# Patient Record
Sex: Male | Born: 1985
Health system: Southern US, Community
[De-identification: ages and names within clinical notes are randomized; demographics above are authoritative.]

## PROBLEM LIST (undated history)

## (undated) DIAGNOSIS — S129XXA Fracture of neck, unspecified, initial encounter: Secondary | ICD-10-CM

## (undated) DIAGNOSIS — G5600 Carpal tunnel syndrome, unspecified upper limb: Secondary | ICD-10-CM

## (undated) HISTORY — PX: SHOULDER SURGERY: SHX246

---
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
## 2005-11-28 ENCOUNTER — Emergency Department (HOSPITAL_COMMUNITY): Admission: EM | Admit: 2005-11-28 | Discharge: 2005-11-28 | Payer: Self-pay | Admitting: Emergency Medicine

## 2005-12-04 ENCOUNTER — Emergency Department (HOSPITAL_COMMUNITY): Admission: EM | Admit: 2005-12-04 | Discharge: 2005-12-04 | Payer: Self-pay | Admitting: Family Medicine

## 2009-08-14 ENCOUNTER — Emergency Department (HOSPITAL_COMMUNITY): Admission: EM | Admit: 2009-08-14 | Discharge: 2009-08-14 | Payer: Self-pay | Admitting: Emergency Medicine

## 2014-03-12 ENCOUNTER — Encounter (HOSPITAL_BASED_OUTPATIENT_CLINIC_OR_DEPARTMENT_OTHER): Payer: Self-pay | Admitting: *Deleted

## 2014-03-12 ENCOUNTER — Emergency Department (HOSPITAL_BASED_OUTPATIENT_CLINIC_OR_DEPARTMENT_OTHER)
Admission: EM | Admit: 2014-03-12 | Discharge: 2014-03-12 | Disposition: A | Discharge status: Home / Self Care | Payer: BC Managed Care – PPO | Attending: Emergency Medicine | Admitting: Emergency Medicine

## 2014-03-12 DIAGNOSIS — Y9389 Activity, other specified: Secondary | ICD-10-CM | POA: Insufficient documentation

## 2014-03-12 DIAGNOSIS — Z720 Tobacco use: Secondary | ICD-10-CM | POA: Insufficient documentation

## 2014-03-12 DIAGNOSIS — Y998 Other external cause status: Secondary | ICD-10-CM | POA: Insufficient documentation

## 2014-03-12 DIAGNOSIS — Y9289 Other specified places as the place of occurrence of the external cause: Secondary | ICD-10-CM | POA: Diagnosis not present

## 2014-03-12 DIAGNOSIS — Z23 Encounter for immunization: Secondary | ICD-10-CM | POA: Diagnosis not present

## 2014-03-12 DIAGNOSIS — X58XXXA Exposure to other specified factors, initial encounter: Secondary | ICD-10-CM | POA: Insufficient documentation

## 2014-03-12 DIAGNOSIS — S60454A Superficial foreign body of right ring finger, initial encounter: Secondary | ICD-10-CM | POA: Diagnosis not present

## 2014-03-12 DIAGNOSIS — S6991XA Unspecified injury of right wrist, hand and finger(s), initial encounter: Secondary | ICD-10-CM | POA: Diagnosis present

## 2014-03-12 DIAGNOSIS — S60459A Superficial foreign body of unspecified finger, initial encounter: Secondary | ICD-10-CM

## 2014-03-12 MED ORDER — TETANUS-DIPHTH-ACELL PERTUSSIS 5-2.5-18.5 LF-MCG/0.5 IM SUSP
0.5000 mL | Freq: Once | INTRAMUSCULAR | Status: AC
  Administered 2014-03-12: 0.5 mL via INTRAMUSCULAR
  Filled 2014-03-12: qty 0.5

## 2014-03-12 MED ORDER — LIDOCAINE HCL (PF) 1 % IJ SOLN
INTRAMUSCULAR | Status: AC
  Filled 2014-03-12: qty 5

## 2014-03-12 MED ORDER — LIDOCAINE HCL (PF) 1 % IJ SOLN
5.0000 mL | Freq: Once | INTRAMUSCULAR | Status: AC
  Administered 2014-03-12: 5 mL via INTRADERMAL
  Filled 2014-03-12: qty 5

## 2014-03-12 NOTE — ED Notes (Signed)
Pt c/o splinter under the nail to right ring finger x 1 hr

## 2014-03-12 NOTE — Discharge Instructions (Signed)
Take Tylenol and Motrin for pain. Keep clean and apply topical antibiotics for 2-3 days. If you were given medicines take as directed.  If you are on coumadin or contraceptives realize their levels and effectiveness is altered by many different medicines.  If you have any reaction (rash, tongues swelling, other) to the medicines stop taking and see a physician.   Please follow up as directed and return to the ER or see a physician for new or worsening symptoms.  Thank you. Filed Vitals:   03/12/14 2058  BP: 91/61  Pulse: 60  Temp: 97.7 F (36.5 C)  TempSrc: Oral  Resp: 18  Height: 5\' 8"  (1.727 m)  Weight: 124 lb (56.246 kg)  SpO2: 100%

## 2014-03-12 NOTE — ED Provider Notes (Signed)
CSN: [id number]     Arrival date & time 03/12/14  2051 History  This chart was scribed for Erik Reyes M Shariyah Eland, MD by Chandni Bhalodia, ED Scribe. This patient was seen in room MH08/MH08 and the patient's care was started at 9:33 PM.   Chief Complaint  Patient presents with  . Finger Injury   Patient is a 28 y.o. male presenting with hand pain. The history is provided by the patient. No language interpreter was used.  Hand Pain This is a new problem. The current episode started less than 1 hour ago. The problem occurs constantly. The problem has not changed since onset.Pertinent negatives include no chest pain, no abdominal pain, no headaches and no shortness of breath. Nothing aggravates the symptoms. Nothing relieves the symptoms. He has tried nothing for the symptoms.   HPI Comments: Erik Reyes is a 28 y.o. male who presents to the Emergency Department complaining of right ring finger injury that occurred 1 hour ago. He states he was installing hard wood floors and had a splinter get lodged into his right ring finger. He denies any other associated pain. He is not actively bleeding upon examination.  History reviewed. No pertinent past medical history. History reviewed. No pertinent past surgical history. History reviewed. No pertinent family history. History  Substance Use Topics  . Smoking status: Current Every Day Smoker -- 0.50 packs/day    Types: Cigarettes  . Smokeless tobacco: Not on file  . Alcohol Use: No   Review of Systems  Respiratory: Negative for shortness of breath.   Cardiovascular: Negative for chest pain.  Gastrointestinal: Negative for abdominal pain.  Skin: Positive for wound.  Neurological: Negative for headaches.  All other systems reviewed and are negative.  Allergies  Review of patient's allergies indicates no known allergies.  Home Medications   Prior to Admission medications   Not on File   Triage Vitals: BP 91/61 mmHg  Pulse 60  Temp(Src)  97.7 F (36.5 C) (Oral)  Resp 18  Ht 5\' 8"  (1.727 m)  Wt 124 lb (56.246 kg)  BMI 18.86 kg/m2  SpO2 100%  Physical Exam  Constitutional: He is oriented to person, place, and time. He appears well-developed and well-nourished. No distress.  HENT:  Head: Normocephalic and atraumatic.  Eyes: Conjunctivae and EOM are normal.  Neck: Neck supple. No tracheal deviation present.  Cardiovascular: Normal rate.   Pulmonary/Chest: Effort normal. No respiratory distress.  Musculoskeletal: Normal range of motion.  Neurological: He is alert and oriented to person, place, and time.  Skin: Skin is warm and dry.  Large splinter extending entire nail bed of right ring finger. There is no bleeding. There is no pus draining. Patient can move and extend finger.  Psychiatric: He has a normal mood and affect. His behavior is normal.  Nursing note and vitals reviewed.  ED Course  Procedures (including critical care time)  DIAGNOSTIC STUDIES: Oxygen Saturation is 100% on RA, normal by my interpretation.    COORDINATION OF CARE: 9:50 PM- Discussed plans to give patient 2Marland Kitchen0mKentuck821yLBarbara Co68Marland Kitchenm3688WH34Marland KitchenmiKentuck316ygBarbara C41Marland KitchenmoKentuck711yBBarbara C76Marland KitchenmoKentuck424yPBarbara CoLond63Marland KitchenmoKentuck261ynBarbara CoParagon45Marland Kitchenm [place]3636yEBarbara CoHunt8Marland KitchenmeKentuck6557yrBarbara CoA7Marland KitchenmtKentuck4532ylBarbara CoS28Marland KitchenmtKentuck664y.Barbara C50Marland KitchenmoKentuck7603yBBarbara CoLincolndocaine 1% injection. Pt advised of plan for treatment and pt agrees.  Labs Review Labs Reviewed - No data to display  Imaging Review No results found.   EKG Interpretation None     MDM  Finger Block 5 mL of 1% lidocaine used proximal ring finger on the right. Finger cleaned with Betadine prior. Patient had minimal sensation distal to finger block afterwards.  Foreign body removal right nailbed Finger block performed. Scissors  and forceps utilized to pull large 1.5 cm splinter Patient tolerated well   Final diagnoses:  Acute foreign body of fingernail, initial encounter   Finger block performed. Wound care performed. Splinter removed from nail bed.   I personally performed the services described in this documentation, which was scribed in my presence. The recorded information has been reviewed and is  accurate.  Enid SkeensJoshua M Ellice Boultinghouse, MD 03/12/14 2207

## 2015-03-04 ENCOUNTER — Encounter (HOSPITAL_BASED_OUTPATIENT_CLINIC_OR_DEPARTMENT_OTHER): Payer: Self-pay | Admitting: *Deleted

## 2015-03-04 ENCOUNTER — Emergency Department (HOSPITAL_BASED_OUTPATIENT_CLINIC_OR_DEPARTMENT_OTHER)
Admission: EM | Admit: 2015-03-04 | Discharge: 2015-03-05 | Disposition: A | Discharge status: Home / Self Care | Payer: BLUE CROSS/BLUE SHIELD | Source: Home / Self Care | Attending: Emergency Medicine | Admitting: Emergency Medicine

## 2015-03-04 DIAGNOSIS — L03113 Cellulitis of right upper limb: Secondary | ICD-10-CM | POA: Diagnosis present

## 2015-03-04 DIAGNOSIS — F17210 Nicotine dependence, cigarettes, uncomplicated: Secondary | ICD-10-CM | POA: Insufficient documentation

## 2015-03-04 DIAGNOSIS — L02511 Cutaneous abscess of right hand: Secondary | ICD-10-CM

## 2015-03-04 DIAGNOSIS — L0390 Cellulitis, unspecified: Principal | ICD-10-CM

## 2015-03-04 DIAGNOSIS — L0291 Cutaneous abscess, unspecified: Secondary | ICD-10-CM

## 2015-03-04 DIAGNOSIS — L02413 Cutaneous abscess of right upper limb: Secondary | ICD-10-CM | POA: Diagnosis not present

## 2015-03-04 MED ORDER — DOXYCYCLINE HYCLATE 100 MG PO TABS
100.0000 mg | ORAL_TABLET | Freq: Once | ORAL | Status: AC
Start: 2015-03-04 — End: 2015-03-04
  Administered 2015-03-04: 100 mg via ORAL
  Filled 2015-03-04: qty 1

## 2015-03-04 MED ORDER — VANCOMYCIN HCL IN DEXTROSE 1-5 GM/200ML-% IV SOLN
1000.0000 mg | Freq: Once | INTRAVENOUS | Status: AC
Start: 2015-03-04 — End: 2015-03-05
  Administered 2015-03-04: 1000 mg via INTRAVENOUS
  Filled 2015-03-04: qty 200

## 2015-03-04 MED ORDER — LIDOCAINE-EPINEPHRINE (PF) 2 %-1:200000 IJ SOLN
INTRAMUSCULAR | Status: AC
  Administered 2015-03-04: 10 mL
  Filled 2015-03-04: qty 10

## 2015-03-04 NOTE — ED Provider Notes (Signed)
CSN: [id number]   Arrival date & time 03/04/15 2210  History  By signing my name below, I, Erik Reyes, attest that this documentation has been prepared under the direction and in the presence of Paula LibraJohn Zita Ozimek, MD. Electronically Signed: Bethel BornBritney Reyes, ED Scribe. 03/04/2015. 11:23 PM.  Chief Complaint  Patient presents with  . Insect Bite    HPI The history is provided by the patient and a significant other. No language interpreter was used.   Erik PriestWilliam C Reyes is a 29 y.o. male who presents to the Emergency Department complaining of an area of redness, pain and swelling at the right forearm with onset 4 days ago. There is minimal pain at rest, moderate pain with palpation or movement. Pt associates the lesion with an insect bite but did not see the insect that bit him. His girlfriend made an incision at the area today and states that " a lot of pus came out". Pt denies fever, chills, nausea, vomiting and diarrhea.   History reviewed. No pertinent past medical history.  History reviewed. No pertinent past surgical history.  No family history on file.  Social History  Substance Use Topics  . Smoking status: Current Every Day Smoker -- 0.50 packs/day    Types: Cigarettes  . Smokeless tobacco: None  . Alcohol Use: No     Review of Systems 10 Systems reviewed and all are negative for acute change except as noted in the HPI. Home Medications   Prior to Admission medications   Not on File    Allergies  Review of patient's allergies indicates no known allergies.  Triage Vitals: BP 120/81 mmHg  Pulse 76  Temp(Src) 97.6 F (36.4 C) (Oral)  Resp 18  Ht 5\' 8"  (1.727 m)  Wt 124 lb (56.246 kg)  BMI 18.86 kg/m2  SpO2 98%  Physical Exam General: Well-developed, well-nourished male in no acute distress; appearance consistent with age of record HENT: normocephalic; atraumatic Eyes: pupils equal, round and reactive to light; extraocular muscles intact Neck: supple Heart:  regular rate and rhythm Lungs: clear to auscultation bilaterally Abdomen: soft; nondistended; nontender; bowel sounds present Extremities: No deformity; full range of motion; pulses normal Neurologic: Awake, alert and oriented; motor function intact in all extremities and symmetric; no facial droop Skin: Warm and dry; tender raised lesion of the right forearm with surrounding erythema:   Psychiatric: Normal mood and affect  ED Course  Procedures  INCISION AND DRAINAGE PROCEDURE NOTE: Patient identification was confirmed and verbal consent was obtained. This procedure was performed by Paula LibraJohn Rilei Kravitz, MD at 11:18 PM. Site: right forearm Sterile procedures observed Needle size: 25 guage Anesthetic used (type and amt): 1.5 cc of lidocaine with epinephrine  Blade size: 11  Drainage: a small amount of purulent drainage  Complexity: Simple  Site anesthetized, incision made over site, wound drained and explored loculations, covered with dry, sterile dressing. Pt tolerated procedure well without complications. Instructions for care discussed verbally and pt provided with additional written instructions for homecare and f/u.   DIAGNOSTIC STUDIES: Oxygen Saturation is 98% on RA, normal by my interpretation.    COORDINATION OF CARE: 11:16 PM Discussed treatment plan which includes abx and I&D with pt at bedside and pt agreed to plan.   MDM  Extent of cellulitis was marked with a skin marking pen and he was given 1 gram of vancomycin in the ED. We will discharge him home on doxycycline. He was advised to return if symptoms worsen.  Final diagnoses:  Cellulitis and  abscess   I personally performed the services described in this documentation, which was scribed in my presence. The recorded information has been reviewed and is accurate.    Paula Libra, MD 03/05/15 [id number]

## 2015-03-04 NOTE — ED Notes (Signed)
Possible insect bite to his right forearm x 3 days. Red, swollen, painful and hot to touch. He has been squeezing it and his girlfriend tried to cut into it. Redness extends to include most of his forearm.

## 2015-03-05 ENCOUNTER — Encounter (HOSPITAL_BASED_OUTPATIENT_CLINIC_OR_DEPARTMENT_OTHER): Payer: Self-pay

## 2015-03-05 ENCOUNTER — Emergency Department (HOSPITAL_BASED_OUTPATIENT_CLINIC_OR_DEPARTMENT_OTHER)
Admission: EM | Admit: 2015-03-05 | Discharge: 2015-03-05 | Disposition: A | Discharge status: Home / Self Care | Payer: BLUE CROSS/BLUE SHIELD | Attending: Emergency Medicine | Admitting: Emergency Medicine

## 2015-03-05 DIAGNOSIS — L0291 Cutaneous abscess, unspecified: Secondary | ICD-10-CM

## 2015-03-05 DIAGNOSIS — F17210 Nicotine dependence, cigarettes, uncomplicated: Secondary | ICD-10-CM | POA: Insufficient documentation

## 2015-03-05 DIAGNOSIS — L02413 Cutaneous abscess of right upper limb: Secondary | ICD-10-CM | POA: Insufficient documentation

## 2015-03-05 MED ORDER — HYDROCODONE-ACETAMINOPHEN 5-325 MG PO TABS: 1.0000 | ORAL_TABLET | Freq: Once | ORAL | Status: DC

## 2015-03-05 MED ORDER — CEFTRIAXONE SODIUM 1 G IJ SOLR
1.0000 g | Freq: Once | INTRAMUSCULAR | Status: AC
  Administered 2015-03-05: 1 g via INTRAMUSCULAR
  Filled 2015-03-05: qty 10

## 2015-03-05 MED ORDER — DOXYCYCLINE HYCLATE 100 MG PO CAPS: 100.0000 mg | ORAL_CAPSULE | Freq: Two times a day (BID) | ORAL | Status: DC

## 2015-03-05 MED ORDER — HYDROCODONE-ACETAMINOPHEN 5-325 MG PO TABS: 2.0000 | ORAL_TABLET | ORAL | Status: DC | PRN

## 2015-03-05 MED ORDER — LIDOCAINE HCL (PF) 1 % IJ SOLN
INTRAMUSCULAR | Status: AC
  Administered 2015-03-05: 2.1 mL
  Filled 2015-03-05: qty 5

## 2015-03-05 NOTE — ED Provider Notes (Signed)
CSN: [id number]     Arrival date & time 03/05/15  1823 History  By signing my name below, I, Erik Reyes, attest that this documentation has been prepared under the direction and in the presence of Rolland PorterMark Macy Polio, MD. Electronically Signed: Evon Slackerrance Reyes, ED Scribe. 03/05/2015. 8:34 PM.     Chief Complaint  Patient presents with  . Cellulitis   The history is provided by the patient. No language interpreter was used.   HPI Comments: Erik Reyes is a 29 y.o. male who presents to the Emergency Department complaining of worsening abscess to his right forearm. Pt states that he has an abscessed drained yesterday and sent home on antibiotics. Pt states that he has not filled the antibiotic prescription. Pt reports the abscess has been draining. He states that the redness is spreading. Pt doesn't report fever nausea or vomiting.     History reviewed. No pertinent past medical history. History reviewed. No pertinent past surgical history. No family history on file. Social History  Substance Use Topics  . Smoking status: Current Every Day Smoker -- 0.50 packs/day    Types: Cigarettes  . Smokeless tobacco: None  . Alcohol Use: No    Review of Systems  Constitutional: Negative for fever, chills, diaphoresis, appetite change and fatigue.  HENT: Negative for mouth sores, sore throat and trouble swallowing.   Eyes: Negative for visual disturbance.  Respiratory: Negative for cough, chest tightness, shortness of breath and wheezing.   Cardiovascular: Negative for chest pain.  Gastrointestinal: Negative for nausea, vomiting, abdominal pain, diarrhea and abdominal distention.  Endocrine: Negative for polydipsia, polyphagia and polyuria.  Genitourinary: Negative for dysuria, frequency and hematuria.  Musculoskeletal: Negative for gait problem.  Skin: Positive for color change and wound (Abcss). Negative for pallor.  Neurological: Negative for dizziness, syncope, light-headedness and  headaches.  Hematological: Does not bruise/bleed easily.  Psychiatric/Behavioral: Negative for behavioral problems and confusion.      Allergies  Review of patient's allergies indicates no known allergies.  Home Medications   Prior to Admission medications   Medication Sig Start Date End Date Taking? Authorizing Provider  doxycycline (VIBRAMYCIN) 100 MG capsule Take 1 capsule (100 mg total) by mouth 2 (two) times daily. One po bid x 7 days 03/05/15   Paula LibraJohn Molpus, MD  HYDROcodone-acetaminophen (NORCO) 5-325 MG tablet Take 2 tablets by mouth every 4 (four) hours as needed (for pain). 03/05/15   John Molpus, MD   BP 119/77 mmHg  Pulse 86  Temp(Src) 98 F (36.7 C) (Oral)  Resp 16  Ht 5\' 8"  (1.727 m)  Wt 124 lb (56.246 kg)  BMI 18.86 kg/m2  SpO2 100%   Physical Exam  Constitutional: He is oriented to person, place, and time. He appears well-developed and well-nourished. No distress.  HENT:  Head: Normocephalic.  Eyes: Conjunctivae are normal. Pupils are equal, round, and reactive to light. No scleral icterus.  Neck: Normal range of motion. Neck supple. No thyromegaly present.  Cardiovascular: Normal rate and regular rhythm.  Exam reveals no gallop and no friction rub.   No murmur heard. Pulmonary/Chest: Effort normal and breath sounds normal. No respiratory distress. He has no wheezes. He has no rales.  Abdominal: Soft. Bowel sounds are normal. He exhibits no distension. There is no tenderness. There is no rebound.  Musculoskeletal: Normal range of motion.  Neurological: He is alert and oriented to person, place, and time.  Skin: Skin is warm and dry. No rash noted.  Area of erythrema that has extended  proximally. Bed side ultra sound shows continued abscess. .5 dollar size of erythema and fluctuance right volar forearm   Psychiatric: He has a normal mood and affect. His behavior is normal.    ED Course  Procedures (including critical care time) DIAGNOSTIC STUDIES: Oxygen  Saturation is 100% on RA, normal by my interpretation.    COORDINATION OF CARE: 8:07 PM-Discussed treatment plan with pt at bedside and pt agreed to plan.     Labs Review Labs Reviewed - No data to display  Imaging Review No results found.    EKG Interpretation None      MDM   Final diagnoses:  Abscess    Bedside ultrasound shows continued area of fluid collection. It is medial to the previous lateral incision and drainage incision from yesterday. He was reanesthetized locally, and  wound was incised and drained. After incision and drainage he was irrigated and gauze placed. Prior to gauze packing, re-P ultrasound shows resolution of the previously noted subcutaneous fluid collection.  INCISION AND DRAINAGE Performed by: Claudean Kinds Consent: Verbal consent obtained. Risks and benefits: risks, benefits and alternatives were discussed Type: abscess  Body area: Right forearm  Anesthesia: local infiltration  Incision was made with a scalpel.  Local anesthetic: lidocaine 1% c epinephrine  Anesthetic total: 4 ml  Complexity: complex Blunt dissection to break up loculations  Drainage: purulent  Drainage amount: Scant, purulent  Packing material: 1/4 in iodoform gauze  Patient tolerance: Patient tolerated the procedure well with no immediate complications.        Rolland Porter, MD 03/05/15 2035

## 2015-03-05 NOTE — Discharge Instructions (Signed)
Soak, then removed the gauze from the wound tomorrow. Fill your prescription, and actually take your antibiotic. Recheck here in 48 hours if not improving.  Abscess An abscess is an infected area that contains a collection of pus and debris.It can occur in almost any part of the body. An abscess is also known as a furuncle or boil. CAUSES  An abscess occurs when tissue gets infected. This can occur from blockage of oil or sweat glands, infection of hair follicles, or a minor injury to the skin. As the body tries to fight the infection, pus collects in the area and creates pressure under the skin. This pressure causes pain. People with weakened immune systems have difficulty fighting infections and get certain abscesses more often.  SYMPTOMS Usually an abscess develops on the skin and becomes a painful mass that is red, warm, and tender. If the abscess forms under the skin, you may feel a moveable soft area under the skin. Some abscesses break open (rupture) on their own, but most will continue to get worse without care. The infection can spread deeper into the body and eventually into the bloodstream, causing you to feel ill.  DIAGNOSIS  Your caregiver will take your medical history and perform a physical exam. A sample of fluid may also be taken from the abscess to determine what is causing your infection. TREATMENT  Your caregiver may prescribe antibiotic medicines to fight the infection. However, taking antibiotics alone usually does not cure an abscess. Your caregiver may need to make a small cut (incision) in the abscess to drain the pus. In some cases, gauze is packed into the abscess to reduce pain and to continue draining the area. HOME CARE INSTRUCTIONS   Only take over-the-counter or prescription medicines for pain, discomfort, or fever as directed by your caregiver.  If you were prescribed antibiotics, take them as directed. Finish them even if you start to feel better.  If gauze is  used, follow your caregiver's directions for changing the gauze.  To avoid spreading the infection:  Keep your draining abscess covered with a bandage.  Wash your hands well.  Do not share personal care items, towels, or whirlpools with others.  Avoid skin contact with others.  Keep your skin and clothes clean around the abscess.  Keep all follow-up appointments as directed by your caregiver. SEEK MEDICAL CARE IF:   You have increased pain, swelling, redness, fluid drainage, or bleeding.  You have muscle aches, chills, or a general ill feeling.  You have a fever. MAKE SURE YOU:   Understand these instructions.  Will watch your condition.  Will get help right away if you are not doing well or get worse.   This information is not intended to replace advice given to you by your health care provider. Make sure you discuss any questions you have with your health care provider.   Document Released: 12/09/2004 Document Revised: 08/31/2011 Document Reviewed: 05/14/2011 Elsevier Interactive Patient Education 2016 [employer]  Incision and Drainage Incision and drainage is a procedure in which a sac-like structure (cystic structure) is opened and drained. The area to be drained usually contains material such as pus, fluid, or blood.  LET YOUR CAREGIVER KNOW ABOUT:   Allergies to medicine.  Medicines taken, including vitamins, herbs, eyedrops, over-the-counter medicines, and creams.  Use of steroids (by mouth or creams).  Previous problems with anesthetics or numbing medicines.  History of bleeding problems or blood clots.  Previous surgery.  Other health problems, including  diabetes and kidney problems.  Possibility of pregnancy, if this applies. RISKS AND COMPLICATIONS  Pain.  Bleeding.  Scarring.  Infection. BEFORE THE PROCEDURE  You may need to have an ultrasound or other imaging tests to see how large or deep your cystic structure is. Blood tests may also  be used to determine if you have an infection or how severe the infection is. You may need to have a tetanus shot. PROCEDURE  The affected area is cleaned with a cleaning fluid. The cyst area will then be numbed with a medicine (local anesthetic). A small incision will be made in the cystic structure. A syringe or catheter may be used to drain the contents of the cystic structure, or the contents may be squeezed out. The area will then be flushed with a cleansing solution. After cleansing the area, it is often gently packed with a gauze or another wound dressing. Once it is packed, it will be covered with gauze and tape or some other type of wound dressing. AFTER THE PROCEDURE   Often, you will be allowed to go home right after the procedure.  You may be given antibiotic medicine to prevent or heal an infection.  If the area was packed with gauze or some other wound dressing, you will likely need to come back in 1 to 2 days to get it removed.  The area should heal in about 14 days.   This information is not intended to replace advice given to you by your health care provider. Make sure you discuss any questions you have with your health care provider.   Document Released: 08/25/2000 Document Revised: 08/31/2011 Document Reviewed: 04/26/2011 Elsevier Interactive Patient Education [employer]

## 2015-03-05 NOTE — Discharge Instructions (Signed)
Cellulitis Cellulitis is an infection of the skin and the tissue beneath it. The infected area is usually red and tender. Cellulitis occurs most often in the arms and lower legs.  CAUSES  Cellulitis is caused by bacteria that enter the skin through cracks or cuts in the skin. The most common types of bacteria that cause cellulitis are staphylococci and streptococci. SIGNS AND SYMPTOMS   Redness and warmth.  Swelling.  Tenderness or pain.  Fever. DIAGNOSIS  Your health care provider can usually determine what is wrong based on a physical exam. Blood tests may also be done. TREATMENT  Treatment usually involves taking an antibiotic medicine. HOME CARE INSTRUCTIONS   Take your antibiotic medicine as directed by your health care provider. Finish the antibiotic even if you start to feel better.  Keep the infected arm or leg elevated to reduce swelling.  Apply a warm cloth to the affected area up to 4 times per day to relieve pain.  Take medicines only as directed by your health care provider.  Keep all follow-up visits as directed by your health care provider. SEEK MEDICAL CARE IF:   You notice red streaks coming from the infected area.  Your red area gets larger or turns dark in color.  Your bone or joint underneath the infected area becomes painful after the skin has healed.  Your infection returns in the same area or another area.  You develop new symptoms.  You have a fever. SEEK IMMEDIATE MEDICAL CARE IF:   You feel very sleepy.  You develop vomiting or diarrhea.  You have a general ill feeling (malaise) with muscle aches and pains.   This information is not intended to replace advice given to you by your health care provider. Make sure you discuss any questions you have with your health care provider.   Document Released: 12/09/2004 Document Revised: 11/20/2014 Document Reviewed: 05/17/2011 Elsevier Interactive Patient Education [employer]

## 2015-03-05 NOTE — ED Notes (Signed)
Recheck of cellulitis to right forearm

## 2016-01-02 ENCOUNTER — Emergency Department (HOSPITAL_BASED_OUTPATIENT_CLINIC_OR_DEPARTMENT_OTHER)
Admission: EM | Admit: 2016-01-02 | Discharge: 2016-01-02 | Disposition: A | Discharge status: Home / Self Care | Payer: BLUE CROSS/BLUE SHIELD | Attending: Emergency Medicine | Admitting: Emergency Medicine

## 2016-01-02 ENCOUNTER — Emergency Department (HOSPITAL_BASED_OUTPATIENT_CLINIC_OR_DEPARTMENT_OTHER): Payer: BLUE CROSS/BLUE SHIELD

## 2016-01-02 ENCOUNTER — Encounter (HOSPITAL_BASED_OUTPATIENT_CLINIC_OR_DEPARTMENT_OTHER): Payer: Self-pay | Admitting: *Deleted

## 2016-01-02 DIAGNOSIS — M62838 Other muscle spasm: Secondary | ICD-10-CM | POA: Insufficient documentation

## 2016-01-02 DIAGNOSIS — F17210 Nicotine dependence, cigarettes, uncomplicated: Secondary | ICD-10-CM | POA: Insufficient documentation

## 2016-01-02 DIAGNOSIS — R0789 Other chest pain: Secondary | ICD-10-CM | POA: Insufficient documentation

## 2016-01-02 MED ORDER — NAPROXEN 500 MG PO TABS: 500.0000 mg | ORAL_TABLET | Freq: Two times a day (BID) | ORAL | 0 refills | Status: DC

## 2016-01-02 MED ORDER — IBUPROFEN 400 MG PO TABS
400.0000 mg | ORAL_TABLET | Freq: Once | ORAL | Status: AC
Start: 2016-01-02 — End: 2016-01-02
  Administered 2016-01-02: 400 mg via ORAL
  Filled 2016-01-02: qty 1

## 2016-01-02 MED ORDER — CYCLOBENZAPRINE HCL 10 MG PO TABS: 10.0000 mg | ORAL_TABLET | Freq: Two times a day (BID) | ORAL | 0 refills | Status: DC | PRN

## 2016-01-02 MED FILL — NAPROXEN 500 MG TABLET: 500 | 15 days supply | Qty: 30 | Fill #0

## 2016-01-02 MED FILL — CYCLOBENZAPRINE 10 MG TAB: 10 | 10 days supply | Qty: 20 | Fill #0

## 2016-01-02 NOTE — ED Provider Notes (Signed)
[facility] Provider [facility]   CSN: [id number] Arrival date & time: 01/02/16  0745     History   Chief Complaint Chief Complaint  Patient presents with  . Chest Pain    HPI Erik Reyes is a 30 y.o. male.  Patient is a 30 year old male with no significant history except for tobacco abuse presenting today with back and chest pain. Patient states that he works in [employer] and is always doing heavy lifting but remembers lifting something extremely heavy yesterday and feeling some pain in his back. However when he woke up this morning he had pain in his back and his chest. It was on the left side of his chest and seems to be worse with a deep breath. The pain is 4 out of 5 and slightly sharp in nature. It does not radiate anywhere except into his back but not into his arms. He denies any cough, shortness of breath, nausea, abdominal pain or vomiting. The pain is starting to improve since 6 AM when he woke up it is still there. He has not taken anything to help improve the pain. Patient states he does often have back And upper extremity pain from his job. He has seen his doctor for this and was referred to an orthopedist but never saw them. He also states that for the last several weeks he wakes up in the mornings with numbness and tingling in his hands bilaterally which resolved within 15-20 minutes of waking up. They thought he may have carpal tunnel but he has not followed up and does not wear braces at this time. He denies any weakness of the upper extremities or lower extremities. He denies any neck pain at this time.   The history is provided by the patient.  Chest Pain      History reviewed. No pertinent past medical history.  There are no active problems to display for this patient.   History reviewed. No pertinent surgical history.     Home Medications    Prior to Admission medications   Medication Sig Start Date End Date Taking? Authorizing  Provider  cyclobenzaprine (FLEXERIL) 10 MG tablet Take 1 tablet (10 mg total) by mouth 2 (two) times daily as needed for muscle spasms. 01/02/16   Gwyneth Sprout, MD  naproxen (NAPROSYN) 500 MG tablet Take 1 tablet (500 mg total) by mouth 2 (two) times daily. 01/02/16   Gwyneth Sprout, MD    Family History History reviewed. No pertinent family history.  Social History Social History  Substance Use Topics  . Smoking status: Current Every Day Smoker    Packs/day: 0.50    Types: Cigarettes  . Smokeless tobacco: Never Used  . Alcohol use No     Allergies   Review of patient's allergies indicates no known allergies.   Review of Systems Review of Systems  Cardiovascular: Positive for chest pain.  All other systems reviewed and are negative.    Physical Exam Updated Vital Signs BP 113/72 (BP Location: Right Arm)   Pulse (!) 52   Temp 98 F (36.7 C) (Oral)   Resp 18   Ht 5\' 8"  (1.727 m)   Wt 124 lb (56.2 kg)   SpO2 100%   BMI 18.85 kg/m   Physical Exam  Constitutional: He is oriented to person, place, and time. He appears well-developed and well-nourished. No distress.  HENT:  Head: Normocephalic and atraumatic.  Mouth/Throat: Oropharynx is clear and moist.  Eyes: Conjunctivae and EOM are  normal. Pupils are equal, round, and reactive to light.  Neck: Normal range of motion. Neck supple.  Cardiovascular: Normal rate, regular rhythm and intact distal pulses.   No murmur heard. Pulmonary/Chest: Effort normal. No respiratory distress. He has decreased breath sounds. He has no wheezes. He has no rales. He exhibits tenderness. He exhibits no mass, no crepitus and no swelling.    Abdominal: Soft. He exhibits no distension. There is no tenderness. There is no rebound and no guarding.  Musculoskeletal: Normal range of motion. He exhibits no edema.       Cervical back: He exhibits tenderness, pain and spasm. He exhibits no bony tenderness.       Back:  Tenderness and  palpable spasm of the left trapezius and thoracic paraspinal muscles on the left  Neurological: He is alert and oriented to person, place, and time. He has normal strength. No sensory deficit.  5 out of 5 upper extremity strength bilaterally  Skin: Skin is warm and dry. No rash noted. No erythema.  Psychiatric: He has a normal mood and affect. His behavior is normal.  Nursing note and vitals reviewed.    ED Treatments / Results  Labs (all labs ordered are listed, but only abnormal results are displayed) Labs Reviewed - No data to display  EKG  EKG Interpretation  Date/Time:  Friday January 02 2016 07:53:53 EDT Ventricular Rate:  58 PR Interval:    QRS Duration: 90 QT Interval:  392 QTC Calculation: 385 R Axis:   70 Text Interpretation:  Sinus rhythm Probable left atrial enlargement Probable left ventricular hypertrophy ST elev, probable normal early repol pattern No previous tracing Confirmed by Anitra Lauth  MD, Alphonzo Lemmings ([id number]) on 01/02/2016 8:07:24 AM       Radiology Dg Chest 2 View  Result Date: 01/02/2016 CLINICAL DATA:  Chest pain.  Tobacco use. EXAM: CHEST  2 VIEW COMPARISON:  None. FINDINGS: Lungs are clear. Heart size and pulmonary vascularity are normal. No adenopathy. No pneumothorax. No bone lesions. IMPRESSION: No edema or consolidation. Electronically Signed   By: Bretta Bang III M.D.   On: 01/02/2016 08:54    Procedures Procedures (including critical care time)  Medications Ordered in ED Medications  ibuprofen (ADVIL,MOTRIN) tablet 400 mg (400 mg Oral Given 01/02/16 0846)     Initial Impression / Assessment and Plan / ED Course  I have reviewed the triage vital signs and the nursing notes.  Pertinent labs & imaging results that were available during my care of the patient were reviewed by me and considered in my medical decision making (see chart for details).  Clinical Course    Patient is a 30 year old healthy male presenting today with chest  pain. Pain is atypical and seems to be most likely musculoskeletal in nature. It is worse with a deep breath and certain movements. It radiates into his back which is also worse when he moves his left arm. He denies any cough, shortness of breath, infectious symptoms or abdominal pain. Low suspicion at this time for dissection, PE or ACS. Patient is PERC negative.  EKG shows early repolarization but otherwise within normal limits and chest x-ray is within normal limits. Patient has a heart score of 1 because he is a smoker but no family history of heart disease and no other concerning symptoms.  Chest x-ray without signs of pneumothorax and will treat for musculoskeletal pain. Also patient has been having numbness and tingling in bilateral hands for the last 2 weeks only when he wakes  up in the morning. Seems that it may be carpal tunnel in nature. He was supposed to follow-up with specialist does not done so yet. Patient given wrist splints to wear at night as this may help his symptoms. He was given naproxen and Flexeril as well.  Final Clinical Impressions(s) / ED Diagnoses   Final diagnoses:  Chest wall pain  Muscle spasm    New Prescriptions Discharge Medication List as of 01/02/2016  9:09 AM    START taking these medications   Details  cyclobenzaprine (FLEXERIL) 10 MG tablet Take 1 tablet (10 mg total) by mouth 2 (two) times daily as needed for muscle spasms., Starting Fri 01/02/2016, Print    naproxen (NAPROSYN) 500 MG tablet Take 1 tablet (500 mg total) by mouth 2 (two) times daily., Starting Fri 01/02/2016, Print         Gwyneth SproutWhitney Vester Balthazor, MD 01/02/16 1059

## 2016-01-02 NOTE — ED Triage Notes (Addendum)
Pt reports awakening with numbness and tingling in his arms and hands every morning, this morning the numbness was accompanied by left sided chest pressure. Pt states pressure is still present rates at 2/10, states pain increases with deep inspiration. Denies dizzyness, sob, nausea or any other c/o. ekg performed prior to pt triage.

## 2016-10-06 ENCOUNTER — Emergency Department (HOSPITAL_BASED_OUTPATIENT_CLINIC_OR_DEPARTMENT_OTHER)
Admission: EM | Admit: 2016-10-06 | Discharge: 2016-10-06 | Disposition: A | Discharge status: Home / Self Care | Payer: 59 | Attending: Emergency Medicine | Admitting: Emergency Medicine

## 2016-10-06 ENCOUNTER — Encounter (HOSPITAL_BASED_OUTPATIENT_CLINIC_OR_DEPARTMENT_OTHER): Payer: Self-pay | Admitting: *Deleted

## 2016-10-06 DIAGNOSIS — G5602 Carpal tunnel syndrome, left upper limb: Secondary | ICD-10-CM | POA: Diagnosis not present

## 2016-10-06 DIAGNOSIS — M25532 Pain in left wrist: Secondary | ICD-10-CM | POA: Diagnosis present

## 2016-10-06 DIAGNOSIS — Z79899 Other long term (current) drug therapy: Secondary | ICD-10-CM | POA: Diagnosis not present

## 2016-10-06 DIAGNOSIS — F17210 Nicotine dependence, cigarettes, uncomplicated: Secondary | ICD-10-CM | POA: Insufficient documentation

## 2016-10-06 DIAGNOSIS — Z8669 Personal history of other diseases of the nervous system and sense organs: Secondary | ICD-10-CM

## 2016-10-06 HISTORY — DX: Carpal tunnel syndrome, unspecified upper limb: G56.00

## 2016-10-06 MED ORDER — IBUPROFEN 400 MG PO TABS
400.0000 mg | ORAL_TABLET | Freq: Once | ORAL | Status: AC
  Administered 2016-10-06: 400 mg via ORAL
  Filled 2016-10-06: qty 1

## 2016-10-06 NOTE — ED Notes (Signed)
ED Provider at bedside. 

## 2016-10-06 NOTE — ED Provider Notes (Signed)
[facility]   CSN: [id number] Arrival date & time: 10/06/16  [id number]     History   Chief Complaint Chief Complaint  Patient presents with  . Arm Pain    HPI Erik Reyes is a 31 y.o. male.  Patient c/o left wrist pain 'for long while now' due to carpal tunnel syndrome, and states he missed work as a result.  Pain is constant, dull, moderate, occasionally radiates upwards towards elbow. Is right hand dominant. Pain is worse w repetitive use. Works in [employer]. Denies fall or acute injury. No radicular pain down arm. States sees Dr Althea CharonMckinley and [facility]. No prior surgery.  No skin changes or lesions.    The history is provided by the patient.  Arm Pain     Past Medical History:  Diagnosis Date  . Carpal tunnel syndrome     There are no active problems to display for this patient.   History reviewed. No pertinent surgical history.     Home Medications    Prior to Admission medications   Medication Sig Start Date End Date Taking? Authorizing Provider  naproxen (NAPROSYN) 500 MG tablet Take 1 tablet (500 mg total) by mouth 2 (two) times daily. 01/02/16  Yes Gwyneth SproutPlunkett, Whitney, MD  cyclobenzaprine (FLEXERIL) 10 MG tablet Take 1 tablet (10 mg total) by mouth 2 (two) times daily as needed for muscle spasms. 01/02/16   Gwyneth SproutPlunkett, Whitney, MD    Family History No family history on file.  Social History Social History  Substance Use Topics  . Smoking status: Current Every Day Smoker    Packs/day: 0.50    Types: Cigarettes  . Smokeless tobacco: Never Used  . Alcohol use No     Allergies   Patient has no known allergies.   Review of Systems Review of Systems  Constitutional: Negative for fever.  Musculoskeletal: Negative for neck pain.  Skin: Negative for rash and wound.  Neurological: Negative for weakness and numbness.     Physical Exam Updated Vital Signs BP 112/76 (BP Location: Right Arm)    Pulse 79   Temp 97.8 F (36.6 C) (Oral)   Resp 18   Ht 1.727 m (5\' 8" )   Wt 56.2 kg (124 lb)   SpO2 100%   BMI 18.85 kg/m   Physical Exam  Constitutional: He appears well-developed and well-nourished. No distress.  Neck: No tracheal deviation present.  Cardiovascular: Intact distal pulses.   Pulmonary/Chest: Effort normal. No accessory muscle usage. No respiratory distress.  Musculoskeletal: He exhibits no edema.  Tenderness at left wrist diffusely, no focal and/or bony pain. No focal scaphoid tenderness. Radial pulse 2+. No sts. No skin changes or erythema. Normal movement digits. r hand,   Neurological: He is alert.  R/M/U nerve fxn left hand intact, motor and sens.   Skin: Skin is warm and dry. No rash noted. He is not diaphoretic.  Psychiatric: He has a normal mood and affect.  Nursing note and vitals reviewed.    ED Treatments / Results  Labs (all labs ordered are listed, but only abnormal results are displayed) Labs Reviewed - No data to display  EKG  EKG Interpretation None       Radiology No results found.  Procedures Procedures (including critical care time)  Medications Ordered in ED Medications  ibuprofen (ADVIL,MOTRIN) tablet 400 mg (not administered)     Initial Impression / Assessment and Plan / ED Course  I have reviewed  the triage vital signs and the nursing notes.  Pertinent labs & imaging results that were available during my care of the patient were reviewed by me and considered in my medical decision making (see chart for details).  Motrin po.   Reviewed nursing notes and prior charts for additional history.   rec motrin or aleve prn.   F/u with his doctors.    Final Clinical Impressions(s) / ED Diagnoses   Final diagnoses:  None    New Prescriptions New Prescriptions   No medications on file     Cathren LaineSteinl, Zuleyma Scharf, MD 10/06/16 [phone]

## 2016-10-06 NOTE — ED Triage Notes (Signed)
Pt has carpal tunnel. States he is seen by [facility] and is saving to have surgery. Pain is worse today, especially left arm

## 2016-10-06 NOTE — Discharge Instructions (Signed)
It was our pleasure to provide your ER care today - we hope that you feel better.  Take motrin or aleve as need for pain.   Avoid repetitive use of left wrist for the next 2-3 days.   Use removable wrist support/splint for comfort - available at [facility], [facility], and medical supply stores.   Follow up with your doctors/orthopedist in the next few weeks.

## 2016-11-25 ENCOUNTER — Emergency Department (HOSPITAL_BASED_OUTPATIENT_CLINIC_OR_DEPARTMENT_OTHER)
Admission: EM | Admit: 2016-11-25 | Discharge: 2016-11-25 | Disposition: A | Discharge status: Home / Self Care | Payer: 59 | Attending: Emergency Medicine | Admitting: Emergency Medicine

## 2016-11-25 ENCOUNTER — Emergency Department (HOSPITAL_BASED_OUTPATIENT_CLINIC_OR_DEPARTMENT_OTHER): Payer: 59

## 2016-11-25 ENCOUNTER — Encounter (HOSPITAL_BASED_OUTPATIENT_CLINIC_OR_DEPARTMENT_OTHER): Payer: Self-pay | Admitting: *Deleted

## 2016-11-25 DIAGNOSIS — Y939 Activity, unspecified: Secondary | ICD-10-CM | POA: Insufficient documentation

## 2016-11-25 DIAGNOSIS — W228XXA Striking against or struck by other objects, initial encounter: Secondary | ICD-10-CM | POA: Insufficient documentation

## 2016-11-25 DIAGNOSIS — S6991XA Unspecified injury of right wrist, hand and finger(s), initial encounter: Secondary | ICD-10-CM | POA: Diagnosis present

## 2016-11-25 DIAGNOSIS — S6721XA Crushing injury of right hand, initial encounter: Secondary | ICD-10-CM | POA: Diagnosis not present

## 2016-11-25 DIAGNOSIS — F17210 Nicotine dependence, cigarettes, uncomplicated: Secondary | ICD-10-CM | POA: Insufficient documentation

## 2016-11-25 DIAGNOSIS — Y999 Unspecified external cause status: Secondary | ICD-10-CM | POA: Diagnosis not present

## 2016-11-25 DIAGNOSIS — Y929 Unspecified place or not applicable: Secondary | ICD-10-CM | POA: Diagnosis not present
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
## 2016-11-25 NOTE — ED Notes (Signed)
Pt given note for work.
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
## 2017-07-15 ENCOUNTER — Emergency Department (HOSPITAL_BASED_OUTPATIENT_CLINIC_OR_DEPARTMENT_OTHER)
Admission: EM | Admit: 2017-07-15 | Discharge: 2017-07-15 | Disposition: A | Discharge status: Home / Self Care | Payer: Self-pay | Attending: Emergency Medicine | Admitting: Emergency Medicine

## 2017-07-15 ENCOUNTER — Emergency Department (HOSPITAL_BASED_OUTPATIENT_CLINIC_OR_DEPARTMENT_OTHER): Payer: Self-pay

## 2017-07-15 ENCOUNTER — Other Ambulatory Visit: Payer: Self-pay

## 2017-07-15 ENCOUNTER — Encounter (HOSPITAL_BASED_OUTPATIENT_CLINIC_OR_DEPARTMENT_OTHER): Payer: Self-pay | Admitting: *Deleted

## 2017-07-15 DIAGNOSIS — F17210 Nicotine dependence, cigarettes, uncomplicated: Secondary | ICD-10-CM | POA: Insufficient documentation

## 2017-07-15 DIAGNOSIS — Y998 Other external cause status: Secondary | ICD-10-CM | POA: Insufficient documentation

## 2017-07-15 DIAGNOSIS — Y9389 Activity, other specified: Secondary | ICD-10-CM | POA: Insufficient documentation

## 2017-07-15 DIAGNOSIS — F1410 Cocaine abuse, uncomplicated: Secondary | ICD-10-CM | POA: Insufficient documentation

## 2017-07-15 DIAGNOSIS — Y929 Unspecified place or not applicable: Secondary | ICD-10-CM | POA: Insufficient documentation

## 2017-07-15 DIAGNOSIS — F1210 Cannabis abuse, uncomplicated: Secondary | ICD-10-CM | POA: Insufficient documentation

## 2017-07-15 DIAGNOSIS — S20212A Contusion of left front wall of thorax, initial encounter: Secondary | ICD-10-CM | POA: Insufficient documentation

## 2017-07-15 MED ORDER — ACETAMINOPHEN 325 MG PO TABS
650.0000 mg | ORAL_TABLET | Freq: Once | ORAL | Status: AC
  Administered 2017-07-15: 650 mg via ORAL
  Filled 2017-07-15: qty 2
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
## 2017-07-15 NOTE — Discharge Instructions (Signed)
You can take Tylenol or Ibuprofen as directed for pain. You can alternate Tylenol and Ibuprofen every 4 hours. If you take Tylenol at 1pm, then you can take Ibuprofen at 5pm. Then you can take Tylenol again at 9pm.   Apply ice to the affected area.  Follow-up with [facility] for [facility].  As we discussed, return to the emergency department immediately for any worsening pain, difficulty breathing, fevers, coughing up blood or any other worsening or concerning symptoms.
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
## 2017-09-02 ENCOUNTER — Other Ambulatory Visit: Payer: Self-pay

## 2017-09-02 ENCOUNTER — Emergency Department (HOSPITAL_BASED_OUTPATIENT_CLINIC_OR_DEPARTMENT_OTHER): Payer: 59

## 2017-09-02 ENCOUNTER — Emergency Department (HOSPITAL_BASED_OUTPATIENT_CLINIC_OR_DEPARTMENT_OTHER)
Admission: EM | Admit: 2017-09-02 | Discharge: 2017-09-02 | Disposition: A | Discharge status: Home / Self Care | Payer: 59 | Attending: Emergency Medicine | Admitting: Emergency Medicine

## 2017-09-02 ENCOUNTER — Encounter (HOSPITAL_BASED_OUTPATIENT_CLINIC_OR_DEPARTMENT_OTHER): Payer: Self-pay

## 2017-09-02 DIAGNOSIS — S3993XA Unspecified injury of pelvis, initial encounter: Secondary | ICD-10-CM | POA: Diagnosis not present

## 2017-09-02 DIAGNOSIS — S39012A Strain of muscle, fascia and tendon of lower back, initial encounter: Secondary | ICD-10-CM | POA: Diagnosis not present

## 2017-09-02 DIAGNOSIS — R202 Paresthesia of skin: Secondary | ICD-10-CM

## 2017-09-02 DIAGNOSIS — Y999 Unspecified external cause status: Secondary | ICD-10-CM | POA: Diagnosis not present

## 2017-09-02 DIAGNOSIS — Y9389 Activity, other specified: Secondary | ICD-10-CM | POA: Insufficient documentation

## 2017-09-02 DIAGNOSIS — R0789 Other chest pain: Secondary | ICD-10-CM | POA: Insufficient documentation

## 2017-09-02 DIAGNOSIS — S299XXA Unspecified injury of thorax, initial encounter: Secondary | ICD-10-CM | POA: Diagnosis not present

## 2017-09-02 DIAGNOSIS — F17210 Nicotine dependence, cigarettes, uncomplicated: Secondary | ICD-10-CM | POA: Insufficient documentation

## 2017-09-02 DIAGNOSIS — S0990XA Unspecified injury of head, initial encounter: Secondary | ICD-10-CM | POA: Diagnosis not present

## 2017-09-02 DIAGNOSIS — R200 Anesthesia of skin: Secondary | ICD-10-CM | POA: Diagnosis not present

## 2017-09-02 DIAGNOSIS — M549 Dorsalgia, unspecified: Secondary | ICD-10-CM | POA: Diagnosis not present

## 2017-09-02 DIAGNOSIS — S3982XA Other specified injuries of lower back, initial encounter: Secondary | ICD-10-CM | POA: Diagnosis present

## 2017-09-02 DIAGNOSIS — Z79899 Other long term (current) drug therapy: Secondary | ICD-10-CM | POA: Insufficient documentation

## 2017-09-02 DIAGNOSIS — Y92410 Unspecified street and highway as the place of occurrence of the external cause: Secondary | ICD-10-CM | POA: Diagnosis not present

## 2017-09-02 DIAGNOSIS — M25531 Pain in right wrist: Secondary | ICD-10-CM | POA: Insufficient documentation

## 2017-09-02 DIAGNOSIS — S3992XA Unspecified injury of lower back, initial encounter: Secondary | ICD-10-CM | POA: Diagnosis not present

## 2017-09-02 DIAGNOSIS — S199XXA Unspecified injury of neck, initial encounter: Secondary | ICD-10-CM | POA: Diagnosis not present

## 2017-09-02 DIAGNOSIS — S3991XA Unspecified injury of abdomen, initial encounter: Secondary | ICD-10-CM | POA: Diagnosis not present

## 2017-09-02 LAB — CBC WITH DIFFERENTIAL/PLATELET
Basophils Absolute: 0.1 10*3/uL (ref 0.0–0.1)
Basophils Relative: 1 %
EOS ABS: 0.3 10*3/uL (ref 0.0–0.7)
Eosinophils Relative: 3 %
HEMATOCRIT: 40.8 % (ref 39.0–52.0)
HEMOGLOBIN: 14.2 g/dL (ref 13.0–17.0)
LYMPHS ABS: 4.5 10*3/uL — AB (ref 0.7–4.0)
Lymphocytes Relative: 47 %
MCH: 29.8 pg (ref 26.0–34.0)
MCHC: 34.8 g/dL (ref 30.0–36.0)
MCV: 85.7 fL (ref 78.0–100.0)
MONO ABS: 0.7 10*3/uL (ref 0.1–1.0)
MONOS PCT: 7 %
NEUTROS ABS: 4 10*3/uL (ref 1.7–7.7)
Neutrophils Relative %: 42 %
Platelets: 225 10*3/uL (ref 150–400)
RBC: 4.76 MIL/uL (ref 4.22–5.81)
RDW: 13 % (ref 11.5–15.5)
WBC: 9.5 10*3/uL (ref 4.0–10.5)

## 2017-09-02 LAB — ETHANOL

## 2017-09-02 LAB — COMPREHENSIVE METABOLIC PANEL
ALK PHOS: 50 U/L (ref 38–126)
ALT: 16 U/L — ABNORMAL LOW (ref 17–63)
ANION GAP: 10 (ref 5–15)
AST: 29 U/L (ref 15–41)
Albumin: 4.2 g/dL (ref 3.5–5.0)
BILIRUBIN TOTAL: 1.1 mg/dL (ref 0.3–1.2)
BUN: 15 mg/dL (ref 6–20)
CALCIUM: 8.7 mg/dL — AB (ref 8.9–10.3)
CO2: 24 mmol/L (ref 22–32)
Chloride: 104 mmol/L (ref 101–111)
Creatinine, Ser: 0.75 mg/dL (ref 0.61–1.24)
GFR calc non Af Amer: >60 mL/min (ref >60)
Glucose, Bld: 114 mg/dL — ABNORMAL HIGH (ref 65–99)
Potassium: 3.7 mmol/L (ref 3.5–5.1)
Sodium: 138 mmol/L (ref 135–145)
TOTAL PROTEIN: 6.9 g/dL (ref 6.5–8.1)

## 2017-09-02 MED ORDER — SODIUM CHLORIDE 0.9 % IV BOLUS
1000.0000 mL | Freq: Once | INTRAVENOUS | Status: AC
  Administered 2017-09-02: 1000 mL via INTRAVENOUS

## 2017-09-02 MED ORDER — CYCLOBENZAPRINE HCL 10 MG PO TABS: 10.0000 mg | ORAL_TABLET | Freq: Two times a day (BID) | ORAL | 0 refills | Status: DC | PRN

## 2017-09-02 MED ORDER — ONDANSETRON HCL 4 MG/2ML IJ SOLN
4.0000 mg | Freq: Once | INTRAMUSCULAR | Status: AC
  Administered 2017-09-02: 4 mg via INTRAVENOUS
  Filled 2017-09-02: qty 2

## 2017-09-02 MED ORDER — MORPHINE SULFATE (PF) 4 MG/ML IV SOLN
4.0000 mg | Freq: Once | INTRAVENOUS | Status: AC
  Administered 2017-09-02: 4 mg via INTRAVENOUS
  Filled 2017-09-02: qty 1

## 2017-09-02 MED ORDER — NAPROXEN 500 MG PO TABS: 500.0000 mg | ORAL_TABLET | Freq: Two times a day (BID) | ORAL | 0 refills | Status: DC

## 2017-09-02 MED ORDER — IOPAMIDOL (ISOVUE-300) INJECTION 61%
100.0000 mL | Freq: Once | INTRAVENOUS | Status: AC | PRN
  Administered 2017-09-02: 100 mL via INTRAVENOUS

## 2017-09-02 NOTE — ED Notes (Signed)
Patient transported to CT 
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
## 2017-09-02 NOTE — Discharge Instructions (Signed)
Take naprosyn for pain.   Take flexeril for muscle spasms.   Rest for 2 days   See your doctor  Return to ER if you have worse back pain, leg pain or numbness, weakness.
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
## 2017-11-14 DIAGNOSIS — R350 Frequency of micturition: Secondary | ICD-10-CM | POA: Diagnosis not present

## 2017-11-14 DIAGNOSIS — G5603 Carpal tunnel syndrome, bilateral upper limbs: Secondary | ICD-10-CM | POA: Diagnosis not present

## 2019-05-03 ENCOUNTER — Other Ambulatory Visit: Payer: Self-pay

## 2019-05-03 ENCOUNTER — Emergency Department (HOSPITAL_BASED_OUTPATIENT_CLINIC_OR_DEPARTMENT_OTHER)
Admission: EM | Admit: 2019-05-03 | Discharge: 2019-05-03 | Disposition: A | Discharge status: Home / Self Care | Payer: 59 | Attending: Emergency Medicine | Admitting: Emergency Medicine

## 2019-05-03 ENCOUNTER — Encounter (HOSPITAL_BASED_OUTPATIENT_CLINIC_OR_DEPARTMENT_OTHER): Payer: Self-pay | Admitting: Emergency Medicine

## 2019-05-03 DIAGNOSIS — F17210 Nicotine dependence, cigarettes, uncomplicated: Secondary | ICD-10-CM | POA: Insufficient documentation

## 2019-05-03 DIAGNOSIS — L03211 Cellulitis of face: Secondary | ICD-10-CM

## 2019-05-03 MED ORDER — SULFAMETHOXAZOLE-TRIMETHOPRIM 800-160 MG PO TABS: 1.0000 | ORAL_TABLET | Freq: Two times a day (BID) | ORAL | 0 refills | Status: AC

## 2019-05-03 MED ORDER — MUPIROCIN CALCIUM 2 % EX CREA
TOPICAL_CREAM | Freq: Every day | CUTANEOUS | Status: DC
  Filled 2019-05-03: qty 15

## 2019-05-03 MED ORDER — MUPIROCIN CALCIUM 2 % EX CREA: 1.0000 "application " | TOPICAL_CREAM | Freq: Two times a day (BID) | CUTANEOUS | 0 refills | Status: DC

## 2019-05-03 MED ORDER — SULFAMETHOXAZOLE-TRIMETHOPRIM 800-160 MG PO TABS
1.0000 | ORAL_TABLET | Freq: Once | ORAL | Status: AC
  Administered 2019-05-03: 1 via ORAL
  Filled 2019-05-03: qty 1
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
## 2020-04-13 ENCOUNTER — Encounter (HOSPITAL_BASED_OUTPATIENT_CLINIC_OR_DEPARTMENT_OTHER): Payer: Self-pay

## 2020-04-13 ENCOUNTER — Other Ambulatory Visit: Payer: Self-pay

## 2020-04-13 ENCOUNTER — Emergency Department (HOSPITAL_BASED_OUTPATIENT_CLINIC_OR_DEPARTMENT_OTHER)
Admission: EM | Admit: 2020-04-13 | Discharge: 2020-04-13 | Disposition: A | Discharge status: Home / Self Care | Payer: Self-pay | Attending: Emergency Medicine | Admitting: Emergency Medicine

## 2020-04-13 DIAGNOSIS — F17210 Nicotine dependence, cigarettes, uncomplicated: Secondary | ICD-10-CM | POA: Insufficient documentation

## 2020-04-13 DIAGNOSIS — M79631 Pain in right forearm: Secondary | ICD-10-CM | POA: Insufficient documentation

## 2020-04-13 MED ORDER — PREDNISONE 20 MG PO TABS: 40.0000 mg | ORAL_TABLET | Freq: Every day | ORAL | 0 refills | Status: DC

## 2020-04-13 NOTE — Discharge Instructions (Addendum)
Take the steroids. Hopefully they'll help with the pain. Follow-up with sports medicine for further evaluation.
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
## 2020-04-13 NOTE — ED Notes (Signed)
ED Provider at bedside. 
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
## 2020-04-17 ENCOUNTER — Telehealth: Payer: Self-pay | Admitting: Family Medicine

## 2020-04-17 NOTE — Telephone Encounter (Signed)
Called pt to offer ED follow up appt for R arm injury--no answer, message left---glh

## 2020-04-21 ENCOUNTER — Other Ambulatory Visit: Payer: Self-pay

## 2020-04-21 ENCOUNTER — Ambulatory Visit: Payer: Self-pay

## 2020-04-21 ENCOUNTER — Ambulatory Visit (INDEPENDENT_AMBULATORY_CARE_PROVIDER_SITE_OTHER): Payer: Self-pay | Admitting: Family Medicine

## 2020-04-21 VITALS — BP 98/70 | Ht 68.0 in | Wt 131.0 lb

## 2020-04-21 DIAGNOSIS — S46011A Strain of muscle(s) and tendon(s) of the rotator cuff of right shoulder, initial encounter: Secondary | ICD-10-CM

## 2020-04-21 DIAGNOSIS — M25511 Pain in right shoulder: Secondary | ICD-10-CM

## 2020-04-21 DIAGNOSIS — G8929 Other chronic pain: Secondary | ICD-10-CM

## 2020-04-21 NOTE — Patient Instructions (Signed)
Nice to meet you Please call to schedule the MRI. [phone] or [phone] You can try the compression  Please try to alternate heat and ice  Please continue range of motion movements  Please send me a message in MyChart with any questions or updates.  We will schedule a virtual visit once the MRI is resulted.   --Dr. Jordan Likes

## 2020-04-21 NOTE — Progress Notes (Signed)
Erik Reyes - 35 y.o. male MRN [id number]  Date of birth: Jul 15, 1985  SUBJECTIVE:  Including CC & ROS.  No chief complaint on file.   Erik Reyes is a 35 y.o. male that is presenting with acute on chronic right shoulder pain.  He had an injury a few months ago where he fell and landed on his elbow.  Since that time he has had ongoing intermittent right shoulder and right arm type pain.  No previous history of surgery or similar pain.  He has the pain that originates around the shoulder joint and radiates distally.  Has tried prednisone which improved his symptoms significantly.  His symptoms do come back at the end of day and at night..  Has had x-rays performed that were normal.   Review of Systems See HPI   HISTORY: Past Medical, Surgical, Social, and Family History Reviewed & Updated per EMR.   Pertinent Historical Findings include:  Past Medical History:  Diagnosis Date  . Carpal tunnel syndrome     No past surgical history on file.  No family history on file.  Social History   Socioeconomic History  . Marital status: Single    Spouse name: Not on file  . Number of children: Not on file  . Years of education: Not on file  . Highest education level: Not on file  Occupational History  . Not on file  Tobacco Use  . Smoking status: Current Every Day Smoker    Packs/day: 0.50    Types: Cigarettes  . Smokeless tobacco: Never Used  Vaping Use  . Vaping Use: Never used  Substance and Sexual Activity  . Alcohol use: No  . Drug use: Not Currently  . Sexual activity: Not on file  Other Topics Concern  . Not on file  Social History Narrative  . Not on file   Social Determinants of Health   Financial Resource Strain: Not on file  Food Insecurity: Not on file  Transportation Needs: Not on file  Physical Activity: Not on file  Stress: Not on file  Social Connections: Not on file  Intimate Partner Violence: Not on file     PHYSICAL EXAM:  VS: BP 98/70  (BP Location: Left Arm, Patient Position: Sitting, Cuff Size: Normal)   Ht 5\' 8"  (1.727 m)   Wt 131 lb (59.4 kg)   BMI 19.92 kg/m  Physical Exam Gen: NAD, alert, cooperative with exam, well-appearing MSK:  Right shoulder: Normal range of motion. Normal strength to resistance. Pain with external rotation and abduction. Positive empty can test. No signs of atrophy Neurovascularly intact  Limited ultrasound: Right shoulder:  Normal-appearing biceps tendon but does have an encircling effusion. Subscapularis has an incomplete tear.  On dynamic testing there is opening of the mid belly of the tendon. Supraspinatus is intact but does have an overlying bursitis. Infraspinatus has overlying hyperemia. Posterior glenohumeral joint does have no effusion.  Summary: An incomplete partial-thickness subscapularis tear with effusion of the glenohumeral joint.  Ultrasound and interpretation by , MD   ASSESSMENT & PLAN:   Traumatic incomplete tear of right rotator cuff Initial injury was roughly 3 months ago.  Having pain worse at night.  No history of surgery or similar pain.  Ultrasound was revealing for effusion of the joint to suggest a possible labral tear as well as the subscapularis partial tear. -Counseled on home exercise therapy and supportive care. -Counseled on compression. -MRI with contrast to evaluate for labral tear and rotator  cuff tear.

## 2020-04-21 NOTE — Assessment & Plan Note (Signed)
Initial injury was roughly 3 months ago.  Having pain worse at night.  No history of surgery or similar pain.  Ultrasound was revealing for effusion of the joint to suggest a possible labral tear as well as the subscapularis partial tear. -Counseled on home exercise therapy and supportive care. -Counseled on compression. -MRI with contrast to evaluate for labral tear and rotator cuff tear.

## 2020-04-22 ENCOUNTER — Other Ambulatory Visit: Payer: Self-pay | Admitting: Family Medicine

## 2020-04-22 DIAGNOSIS — S46011A Strain of muscle(s) and tendon(s) of the rotator cuff of right shoulder, initial encounter: Secondary | ICD-10-CM

## 2020-05-15 ENCOUNTER — Other Ambulatory Visit: Payer: Self-pay

## 2020-05-15 ENCOUNTER — Inpatient Hospital Stay: Admission: RE | Admit: 2020-05-15 | Payer: Self-pay | Source: Ambulatory Visit

## 2020-06-29 ENCOUNTER — Encounter (HOSPITAL_BASED_OUTPATIENT_CLINIC_OR_DEPARTMENT_OTHER): Payer: Self-pay | Admitting: Emergency Medicine

## 2020-06-29 ENCOUNTER — Emergency Department (HOSPITAL_BASED_OUTPATIENT_CLINIC_OR_DEPARTMENT_OTHER): Payer: 59

## 2020-06-29 ENCOUNTER — Other Ambulatory Visit: Payer: Self-pay

## 2020-06-29 ENCOUNTER — Emergency Department (HOSPITAL_BASED_OUTPATIENT_CLINIC_OR_DEPARTMENT_OTHER)
Admission: EM | Admit: 2020-06-29 | Discharge: 2020-06-29 | Disposition: A | Discharge status: Home / Self Care | Payer: 59 | Attending: Emergency Medicine | Admitting: Emergency Medicine

## 2020-06-29 DIAGNOSIS — S0211EA Type III occipital condyle fracture, right side, initial encounter for closed fracture: Secondary | ICD-10-CM

## 2020-06-29 DIAGNOSIS — R202 Paresthesia of skin: Secondary | ICD-10-CM | POA: Diagnosis not present

## 2020-06-29 DIAGNOSIS — F17210 Nicotine dependence, cigarettes, uncomplicated: Secondary | ICD-10-CM | POA: Insufficient documentation

## 2020-06-29 DIAGNOSIS — M545 Low back pain, unspecified: Secondary | ICD-10-CM | POA: Insufficient documentation

## 2020-06-29 DIAGNOSIS — M549 Dorsalgia, unspecified: Secondary | ICD-10-CM

## 2020-06-29 DIAGNOSIS — R109 Unspecified abdominal pain: Secondary | ICD-10-CM | POA: Insufficient documentation

## 2020-06-29 DIAGNOSIS — S0990XA Unspecified injury of head, initial encounter: Secondary | ICD-10-CM | POA: Diagnosis present

## 2020-06-29 DIAGNOSIS — M542 Cervicalgia: Secondary | ICD-10-CM | POA: Diagnosis not present

## 2020-06-29 DIAGNOSIS — R079 Chest pain, unspecified: Secondary | ICD-10-CM | POA: Diagnosis not present

## 2020-06-29 LAB — COMPREHENSIVE METABOLIC PANEL
ALT: 33 U/L (ref 0–44)
AST: 26 U/L (ref 15–41)
Albumin: 4.1 g/dL (ref 3.5–5.0)
Alkaline Phosphatase: 50 U/L (ref 38–126)
Anion gap: 9 (ref 5–15)
BUN: 12 mg/dL (ref 6–20)
CO2: 24 mmol/L (ref 22–32)
Calcium: 9 mg/dL (ref 8.9–10.3)
Chloride: 103 mmol/L (ref 98–111)
Creatinine, Ser: 0.61 mg/dL (ref 0.61–1.24)
GFR, Estimated: >60 mL/min (ref >60)
Glucose, Bld: 106 mg/dL — ABNORMAL HIGH (ref 70–99)
Potassium: 3.8 mmol/L (ref 3.5–5.1)
Sodium: 136 mmol/L (ref 135–145)
Total Bilirubin: 0.7 mg/dL (ref 0.3–1.2)
Total Protein: 7.1 g/dL (ref 6.5–8.1)

## 2020-06-29 LAB — CBC WITH DIFFERENTIAL/PLATELET
Abs Immature Granulocytes: 0.02 10*3/uL (ref 0.00–0.07)
Basophils Absolute: 0.1 10*3/uL (ref 0.0–0.1)
Basophils Relative: 1 %
Eosinophils Absolute: 0.3 10*3/uL (ref 0.0–0.5)
Eosinophils Relative: 3 %
HCT: 45 % (ref 39.0–52.0)
Hemoglobin: 15.4 g/dL (ref 13.0–17.0)
Immature Granulocytes: 0 %
Lymphocytes Relative: 40 %
Lymphs Abs: 3.8 10*3/uL (ref 0.7–4.0)
MCH: 29.6 pg (ref 26.0–34.0)
MCHC: 34.2 g/dL (ref 30.0–36.0)
MCV: 86.5 fL (ref 80.0–100.0)
Monocytes Absolute: 0.6 10*3/uL (ref 0.1–1.0)
Monocytes Relative: 6 %
Neutro Abs: 4.7 10*3/uL (ref 1.7–7.7)
Neutrophils Relative %: 50 %
Platelets: 264 10*3/uL (ref 150–400)
RBC: 5.2 MIL/uL (ref 4.22–5.81)
RDW: 12.5 % (ref 11.5–15.5)
WBC: 9.5 10*3/uL (ref 4.0–10.5)
nRBC: 0 % (ref 0.0–0.2)

## 2020-06-29 MED ORDER — IOHEXOL 300 MG/ML  SOLN
100.0000 mL | Freq: Once | INTRAMUSCULAR | Status: AC | PRN
  Administered 2020-06-29: 100 mL via INTRAVENOUS

## 2020-06-29 MED ORDER — MORPHINE SULFATE (PF) 2 MG/ML IV SOLN
2.0000 mg | Freq: Once | INTRAVENOUS | Status: AC
  Administered 2020-06-29: 2 mg via INTRAVENOUS
  Filled 2020-06-29: qty 1

## 2020-06-29 MED ORDER — MORPHINE SULFATE (PF) 4 MG/ML IV SOLN
4.0000 mg | Freq: Once | INTRAVENOUS | Status: AC
  Administered 2020-06-29: 4 mg via INTRAVENOUS
  Filled 2020-06-29: qty 1

## 2020-06-29 MED ORDER — OXYCODONE-ACETAMINOPHEN 5-325 MG PO TABS: 1.0000 | ORAL_TABLET | Freq: Three times a day (TID) | ORAL | 0 refills | Status: AC | PRN
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
## 2020-06-29 NOTE — Discharge Instructions (Signed)
You have an occipital condyle fracture on the right side.  You are placed in a neck brace you must keep the brace on at all times.  I have given you 2 braces the plastic brace you may wear into the shower, when you are not in the shower please where the padded soft brace.  I have given you pain medications please take as prescribed.  Please aware this medication can make you drowsy do not consume alcohol or operate heavy machinery while taking this medication.  Please beware this medication has Tylenol in it do not consume Tylenol while taking this.  You may take ibuprofen as needed, please follow dosing the back of bottle..  It is important that you follow-up with neurosurgery, I given the contact admission for Dr. Mikal Plane please call his office tomorrow to schedule your follow-up appointment.  Come back to the emergency department if you develop chest pain, shortness of breath, severe abdominal pain, uncontrolled nausea, vomiting, diarrhea.

## 2020-07-17 ENCOUNTER — Other Ambulatory Visit (HOSPITAL_COMMUNITY): Payer: Self-pay | Admitting: Neurosurgery

## 2020-07-17 ENCOUNTER — Other Ambulatory Visit: Payer: Self-pay | Admitting: Neurosurgery

## 2020-07-17 DIAGNOSIS — S02113A Unspecified occipital condyle fracture, initial encounter for closed fracture: Secondary | ICD-10-CM

## 2020-07-25 ENCOUNTER — Telehealth: Payer: Self-pay | Admitting: *Deleted

## 2020-07-25 NOTE — Telephone Encounter (Signed)
New authorization number is [id number] with active dates of 07/25/20-09/08/20  Case number was [id number]

## 2020-07-30 ENCOUNTER — Other Ambulatory Visit: Payer: Self-pay

## 2020-07-30 ENCOUNTER — Ambulatory Visit (HOSPITAL_COMMUNITY)
Admission: RE | Admit: 2020-07-30 | Discharge: 2020-07-30 | Disposition: A | Discharge status: Home / Self Care | Payer: 59 | Source: Ambulatory Visit | Attending: Neurosurgery | Admitting: Neurosurgery

## 2020-07-30 DIAGNOSIS — S02113A Unspecified occipital condyle fracture, initial encounter for closed fracture: Secondary | ICD-10-CM

## 2020-07-31 ENCOUNTER — Ambulatory Visit
Admission: RE | Admit: 2020-07-31 | Discharge: 2020-07-31 | Disposition: A | Discharge status: Home / Self Care | Payer: 59 | Source: Ambulatory Visit | Attending: Family Medicine | Admitting: Family Medicine

## 2020-07-31 DIAGNOSIS — S46011A Strain of muscle(s) and tendon(s) of the rotator cuff of right shoulder, initial encounter: Secondary | ICD-10-CM

## 2020-07-31 MED ORDER — IOPAMIDOL (ISOVUE-M 200) INJECTION 41%
12.0000 mL | Freq: Once | INTRAMUSCULAR | Status: AC
  Administered 2020-07-31: 12 mL via INTRA_ARTICULAR

## 2020-08-06 ENCOUNTER — Telehealth: Payer: Self-pay | Admitting: Family Medicine

## 2020-08-06 ENCOUNTER — Other Ambulatory Visit: Payer: Self-pay

## 2020-08-06 ENCOUNTER — Telehealth (INDEPENDENT_AMBULATORY_CARE_PROVIDER_SITE_OTHER): Payer: 59 | Admitting: Family Medicine

## 2020-08-06 DIAGNOSIS — S46011D Strain of muscle(s) and tendon(s) of the rotator cuff of right shoulder, subsequent encounter: Secondary | ICD-10-CM | POA: Diagnosis not present

## 2020-08-06 NOTE — Telephone Encounter (Signed)
Patient states provider was waiting on his decision if he wanted Ortho Surgery (Pt says yes to surg & wish Dr.Schmitz to Recommend who.)   --Forwarding message to provider --advised pt either SM or the Ortho provider's office will contact him w/appt info.  --glh

## 2020-08-06 NOTE — Assessment & Plan Note (Signed)
Has a complete full-thickness mildly retracted tear of the subscapularis with subacromial bursitis and a loose body. -Counseled on home exercise therapy and supportive care. -Discussed surgery and he will inform orthopedic decision. -Could consider injection.

## 2020-08-06 NOTE — Progress Notes (Signed)
Virtual Visit via Telephone Note  I connected with Erik Reyes on 08/06/20 at  1:10 PM EDT by telephone and verified that I am speaking with the correct person using two identifiers.  Location: Patient: home Provider: office   I discussed the limitations, risks, security and privacy concerns of performing an evaluation and management service by telephone and the availability of in person appointments. I also discussed with the patient that there may be a patient responsible charge related to this service. The patient expressed understanding and agreed to proceed.   History of Present Illness:  [name] is a 35 year old male that is following up after the MRI of his right shoulder.  This was demonstrating a complete full-thickness mildly retracted tear of the subscapularis and large subacromial bursitis and loose bodies.   Observations/Objective:   Assessment and Plan:  Traumatic incomplete tear of the right rotator cuff: Has a complete full-thickness mildly retracted tear of the subscapularis with subacromial bursitis and a loose body. -Counseled on home exercise therapy and supportive care. -Discussed surgery and he will inform orthopedic decision. -Could consider injection.  Follow Up Instructions:    I discussed the assessment and treatment plan with the patient. The patient was provided an opportunity to ask questions and all were answered. The patient agreed with the plan and demonstrated an understanding of the instructions.   The patient was advised to call back or seek an in-person evaluation if the symptoms worsen or if the condition fails to improve as anticipated.  I provided 8 minutes of non-face-to-face time during this encounter.   Clare Gandy, MD

## 2020-08-12 NOTE — Addendum Note (Signed)
Addended by: Clare Gandy E on: 08/12/2020 01:44 PM   Modules accepted: Level of Service

## 2021-06-07 ENCOUNTER — Other Ambulatory Visit: Payer: Self-pay

## 2021-06-07 ENCOUNTER — Ambulatory Visit
Admission: EM | Admit: 2021-06-07 | Discharge: 2021-06-07 | Disposition: A | Discharge status: Home / Self Care | Payer: BC Managed Care – PPO | Attending: Emergency Medicine | Admitting: Emergency Medicine

## 2021-06-07 DIAGNOSIS — F17200 Nicotine dependence, unspecified, uncomplicated: Secondary | ICD-10-CM | POA: Diagnosis not present

## 2021-06-07 DIAGNOSIS — R197 Diarrhea, unspecified: Secondary | ICD-10-CM | POA: Diagnosis not present

## 2021-06-07 DIAGNOSIS — Z9189 Other specified personal risk factors, not elsewhere classified: Secondary | ICD-10-CM

## 2021-06-07 DIAGNOSIS — F1520 Other stimulant dependence, uncomplicated: Secondary | ICD-10-CM

## 2021-06-07 DIAGNOSIS — K219 Gastro-esophageal reflux disease without esophagitis: Secondary | ICD-10-CM

## 2021-06-07 LAB — POCT URINALYSIS DIP (MANUAL ENTRY)
Bilirubin, UA: NEGATIVE
Blood, UA: NEGATIVE
Glucose, UA: NEGATIVE mg/dL
Ketones, POC UA: NEGATIVE mg/dL
Leukocytes, UA: NEGATIVE
Nitrite, UA: NEGATIVE
Protein Ur, POC: NEGATIVE mg/dL
Spec Grav, UA: 1.02 (ref 1.010–1.025)
Urobilinogen, UA: 0.2 E.U./dL
pH, UA: 7 (ref 5.0–8.0)

## 2021-06-07 MED ORDER — DICYCLOMINE HCL 10 MG PO CAPS: 10.0000 mg | ORAL_CAPSULE | Freq: Three times a day (TID) | ORAL | 0 refills | Status: AC

## 2021-06-07 MED ORDER — LANSOPRAZOLE 30 MG PO CPDR: 30.0000 mg | DELAYED_RELEASE_CAPSULE | Freq: Every day | ORAL | 1 refills | Status: AC

## 2021-06-07 NOTE — ED Triage Notes (Signed)
Pt c/o abd pain onset ~ Wed. States "it feels like someone punched me in the kidney." Associated night sweats, unusual bowel movements. Challenging to get clear responses 2/2 family at bedside. Family states feces was "blackish" today.  ? ?Denies nausea, vomiting,  ?

## 2021-06-07 NOTE — ED Provider Notes (Signed)
?[facility] ? ? ? ?CSN: [id number] ?Arrival date & time: 06/07/21  [id number] ?  ? ?HISTORY  ? ?Chief Complaint  ?Patient presents with  ? Abdominal Pain  ? ?HPI ?Erik Reyes is a 36 y.o. male. Pt is here with his wife, patient c/o abd pain onset about 5 days ago wed. States "it feels like someone punched me in the kidney."  Complains of associated night sweats with cold limbs, frequent loose bowel movements, for yesterday morning and the color of his loose stool was black, denies liquid stool, reports associated abdominal cramping with each movement.  Patient denies firm stool in the past 5 days.  Wife states patient has a history of gastroesophageal reflux disease not currently taking any medications for this.  Wife also states that he drinks significant amount of caffeine including red bull and coffee, drinks a lot of soda, Gatorade and juice as well, eats minimal to no fruits and vegetables and smokes heavily. ? ?The history is provided by the patient.  ?Past Medical History:  ?Diagnosis Date  ? Carpal tunnel syndrome   ? ?Patient Active Problem List  ? Diagnosis Date Noted  ? Traumatic incomplete tear of right rotator cuff 04/21/2020  ? ?History reviewed. No pertinent surgical history. ? ?Home Medications   ? ?Prior to Admission medications   ?Not on File  ? ? ?Family History ?History reviewed. No pertinent family history. ?Social History ?Social History  ? ?Tobacco Use  ? Smoking status: Every Day  ?  Packs/day: 0.50  ?  Types: Cigarettes  ? Smokeless tobacco: Never  ?Vaping Use  ? Vaping Use: Never used  ?Substance Use Topics  ? Alcohol use: No  ? Drug use: Not Currently  ? ?Allergies   ?Patient has no known allergies. ? ?Review of Systems ?Review of Systems ?Pertinent findings noted in history of present illness.  ? ?Physical Exam ?Triage Vital Signs ?ED Triage Vitals  ?Enc Vitals Group  ?   BP 01/09/21 0827 (!) 147/82  ?   Pulse Rate 01/09/21 0827 72  ?   Resp 01/09/21 0827 18  ?   Temp  01/09/21 0827 98.3 ?F (36.8 ?C)  ?   Temp Source 01/09/21 0827 Oral  ?   SpO2 01/09/21 0827 98 %  ?   Weight --   ?   Height --   ?   Head Circumference --   ?   Peak Flow --   ?   Pain Score 01/09/21 0826 5  ?   Pain Loc --   ?   Pain Edu? --   ?   Excl. in GC? --   ?No data found. ? ?Updated Vital Signs ?BP 116/69 (BP Location: Left Arm)   Pulse 60   Temp 98.1 ?F (36.7 ?C) (Oral)   Resp 18   SpO2 98%  ? ?Physical Exam ?Vitals and nursing note reviewed.  ?Constitutional:   ?   General: He is not in acute distress. ?   Appearance: Normal appearance. He is not ill-appearing.  ?HENT:  ?   Head: Normocephalic and atraumatic.  ?Eyes:  ?   General: Lids are normal.     ?   Right eye: No discharge.     ?   Left eye: No discharge.  ?   Extraocular Movements: Extraocular movements intact.  ?   Conjunctiva/sclera: Conjunctivae normal.  ?   Right eye: Right conjunctiva is not injected.  ?   Left eye: Left conjunctiva is  not injected.  ?Neck:  ?   Trachea: Trachea and phonation normal.  ?Cardiovascular:  ?   Rate and Rhythm: Regular rhythm.  ?   Pulses: Normal pulses.  ?   Heart sounds: Normal heart sounds. No murmur heard. ?  No friction rub. No gallop.  ?Pulmonary:  ?   Effort: Pulmonary effort is normal. No accessory muscle usage, prolonged expiration or respiratory distress.  ?   Breath sounds: Normal breath sounds. No stridor, decreased air movement or transmitted upper airway sounds. No decreased breath sounds, wheezing, rhonchi or rales.  ?Chest:  ?   Chest wall: No tenderness.  ?Abdominal:  ?   General: Abdomen is flat. Bowel sounds are increased. There is no distension.  ?   Palpations: Abdomen is soft.  ?   Tenderness: There is generalized abdominal tenderness. There is no right CVA tenderness, left CVA tenderness, guarding or rebound. Negative signs include Murphy's sign, Rovsing's sign, McBurney's sign, psoas sign and obturator sign.  ?   Hernia: No hernia is present.  ?Musculoskeletal:     ?   General: Normal  range of motion.  ?   Cervical back: Normal range of motion and neck supple. Normal range of motion.  ?Lymphadenopathy:  ?   Cervical: No cervical adenopathy.  ?Skin: ?   General: Skin is warm and dry.  ?   Findings: No erythema or rash.  ?Neurological:  ?   General: No focal deficit present.  ?   Mental Status: He is alert and oriented to person, place, and time.  ?Psychiatric:     ?   Mood and Affect: Mood normal.     ?   Behavior: Behavior normal.  ? ? ?Visual Acuity ?Right Eye Distance:   ?Left Eye Distance:   ?Bilateral Distance:   ? ?Right Eye Near:   ?Left Eye Near:    ?Bilateral Near:    ? ?UC Couse / Diagnostics / Procedures:  ?  ?EKG ? ?Radiology ?No results found. ? ?Procedures ?Procedures (including critical care time) ? ?UC Diagnoses / Final Clinical Impressions(s)   ?I have reviewed the triage vital signs and the nursing notes. ? ?Pertinent labs & imaging results that were available during my care of the patient were reviewed by me and considered in my medical decision making (see chart for details).   ? ?Final diagnoses:  ?Gastroesophageal reflux disease without esophagitis  ?Diarrhea, unspecified type  ?Has poorly balanced diet  ?Tobacco dependence  ?Caffeine dependence (HCC)  ? ?Patient provided with a PPI for GERD symptoms.  Patient advised that if he is having a dark stool, it is possible that he has some bleeding in his lower GI tract.  Recommend that he begin a clear liquid diet for now and take Bentyl as needed for abdominal cramping and diarrhea.  Patient advised that if he has worsening symptoms throughout the week symptoms do not improve after clear liquid diet Bentyl for 3 days, he should go to the emergency room for further more acute evaluation. ? ?Patient provided with information regarding high-fiber diet and foods to eat with gastroesophageal reflux disease that he can give consideration to once he is feeling better overall. ? ?Patient also advised to quit smoking and decrease caffeine  use. ?ED Prescriptions   ? ? Medication Sig Dispense Auth. Provider  ? lansoprazole (PREVACID) 30 MG capsule Take 1 capsule (30 mg total) by mouth daily at 12 noon. 90 capsule Theadora Rama Scales, PA-C  ? dicyclomine (BENTYL) 10 MG  capsule Take 1 capsule (10 mg total) by mouth 3 (three) times daily before meals. 90 capsule Theadora Rama Scales, PA-C  ? ?  ? ?PDMP not reviewed this encounter. ? ?Pending results:  ?Labs Reviewed  ?POCT URINALYSIS DIP (MANUAL ENTRY)  ? ? ?Medications Ordered in UC: ?Medications - No data to display ? ?Disposition Upon Discharge:  ?Condition: stable for discharge home ?Home: take medications as prescribed; routine discharge instructions as discussed; follow up as advised. ? ?Patient presented with an acute illness with associated systemic symptoms and significant discomfort requiring urgent management. In my opinion, this is a condition that a prudent lay person (someone who possesses an average knowledge of health and medicine) may potentially expect to result in complications if not addressed urgently such as respiratory distress, impairment of bodily function or dysfunction of bodily organs.  ? ?Routine symptom specific, illness specific and/or disease specific instructions were discussed with the patient and/or caregiver at length.  ? ?As such, the patient has been evaluated and assessed, work-up was performed and treatment was provided in alignment with urgent care protocols and evidence based medicine.  Patient/parent/caregiver has been advised that the patient may require follow up for further testing and treatment if the symptoms continue in spite of treatment, as clinically indicated and appropriate. ? ?If the patient was tested for COVID-19, Influenza and/or RSV, then the patient/parent/guardian was advised to isolate at home pending the results of his/her diagnostic coronavirus test and potentially longer if they?re positive. I have also advised pt that if his/her  COVID-19 test returns positive, it's recommended to self-isolate for at least 10 days after symptoms first appeared AND until fever-free for 24 hours without fever reducer AND other symptoms have improved or resolv

## 2021-06-07 NOTE — Discharge Instructions (Addendum)
Please read the enclosed information regarding clear liquid diet and diarrhea.  Also provided you with information regarding the health risks of smoking. ? ?Once you are feeling better, please give consideration to the information regarding food choices for gastroesophageal reflux disease and high fiber eating plan. ? ?For the next 3 to 5 days, please begin taking dicyclomine 3 times daily as needed for diarrhea and eat a full clear liquid diet.  Pain products and decrease your smoking as much as you can. ? ?I would also like for you to begin taking lansoprazole (Prevacid) once daily and to continue taking Prevacid for the least the next 2 weeks if not the next 3 months.  Prevacid does not work right away so please be patient with the onboarding process medication.  You should experience full relief of reflux symptoms after being on this medication for a week. ? ?In the neck 7 days, if you have significant worsening of your symptoms please go to the emergency room for further evaluation.  You will need imaging of your abdomen to look for any signs of infectious process. ? ?Thank you for visiting urgent care today.  It is my hope that you feel better soon. ?

## 2021-06-10 ENCOUNTER — Emergency Department (HOSPITAL_BASED_OUTPATIENT_CLINIC_OR_DEPARTMENT_OTHER)
Admission: EM | Admit: 2021-06-10 | Discharge: 2021-06-10 | Disposition: A | Discharge status: Home / Self Care | Payer: BC Managed Care – PPO | Attending: Emergency Medicine | Admitting: Emergency Medicine

## 2021-06-10 ENCOUNTER — Encounter (HOSPITAL_BASED_OUTPATIENT_CLINIC_OR_DEPARTMENT_OTHER): Payer: Self-pay | Admitting: Emergency Medicine

## 2021-06-10 ENCOUNTER — Other Ambulatory Visit: Payer: Self-pay

## 2021-06-10 ENCOUNTER — Emergency Department (HOSPITAL_BASED_OUTPATIENT_CLINIC_OR_DEPARTMENT_OTHER): Payer: BC Managed Care – PPO

## 2021-06-10 DIAGNOSIS — R109 Unspecified abdominal pain: Secondary | ICD-10-CM | POA: Diagnosis present

## 2021-06-10 DIAGNOSIS — R197 Diarrhea, unspecified: Secondary | ICD-10-CM

## 2021-06-10 DIAGNOSIS — K824 Cholesterolosis of gallbladder: Secondary | ICD-10-CM | POA: Insufficient documentation

## 2021-06-10 LAB — COMPREHENSIVE METABOLIC PANEL
ALT: 18 U/L (ref 0–44)
AST: 22 U/L (ref 15–41)
Albumin: 3.9 g/dL (ref 3.5–5.0)
Alkaline Phosphatase: 56 U/L (ref 38–126)
Anion gap: 6 (ref 5–15)
BUN: 12 mg/dL (ref 6–20)
CO2: 27 mmol/L (ref 22–32)
Calcium: 8.9 mg/dL (ref 8.9–10.3)
Chloride: 102 mmol/L (ref 98–111)
Creatinine, Ser: 0.89 mg/dL (ref 0.61–1.24)
GFR, Estimated: >60 mL/min (ref >60)
Glucose, Bld: 91 mg/dL (ref 70–99)
Potassium: 3.5 mmol/L (ref 3.5–5.1)
Sodium: 135 mmol/L (ref 135–145)
Total Bilirubin: 0.8 mg/dL (ref 0.3–1.2)
Total Protein: 6.8 g/dL (ref 6.5–8.1)

## 2021-06-10 LAB — CBC
HCT: 43.6 % (ref 39.0–52.0)
Hemoglobin: 14.9 g/dL (ref 13.0–17.0)
MCH: 29.4 pg (ref 26.0–34.0)
MCHC: 34.2 g/dL (ref 30.0–36.0)
MCV: 86.2 fL (ref 80.0–100.0)
Platelets: 258 10*3/uL (ref 150–400)
RBC: 5.06 MIL/uL (ref 4.22–5.81)
RDW: 12.1 % (ref 11.5–15.5)
WBC: 7.4 10*3/uL (ref 4.0–10.5)
nRBC: 0 % (ref 0.0–0.2)

## 2021-06-10 LAB — URINALYSIS, ROUTINE W REFLEX MICROSCOPIC
Bilirubin Urine: NEGATIVE
Glucose, UA: NEGATIVE mg/dL
Hgb urine dipstick: NEGATIVE
Ketones, ur: NEGATIVE mg/dL
Leukocytes,Ua: NEGATIVE
Nitrite: NEGATIVE
Protein, ur: NEGATIVE mg/dL
Specific Gravity, Urine: 1.02 (ref 1.005–1.030)
pH: 6 (ref 5.0–8.0)

## 2021-06-10 LAB — LIPASE, BLOOD: Lipase: 30 U/L (ref 11–51)

## 2021-06-10 MED ORDER — SODIUM CHLORIDE 0.9 % IV BOLUS
1000.0000 mL | Freq: Once | INTRAVENOUS | Status: AC
  Administered 2021-06-10: 1000 mL via INTRAVENOUS

## 2021-06-10 MED ORDER — IOHEXOL 300 MG/ML  SOLN
100.0000 mL | Freq: Once | INTRAMUSCULAR | Status: AC | PRN
  Administered 2021-06-10: 100 mL via INTRAVENOUS

## 2021-06-10 MED ORDER — LOPERAMIDE HCL 2 MG PO CAPS: 2.0000 mg | ORAL_CAPSULE | Freq: Four times a day (QID) | ORAL | 0 refills | Status: DC | PRN

## 2021-06-10 MED ORDER — LOPERAMIDE HCL 2 MG PO CAPS: 4.0000 mg | ORAL_CAPSULE | Freq: Four times a day (QID) | ORAL | 0 refills | Status: AC | PRN

## 2021-06-10 NOTE — ED Triage Notes (Signed)
Pt c/o persistent abd pain x 1 wk; was seen at [facility] and dx w/ colitis (w/o imaging); denies vomiting; stools are loose, but pt sts he feels constipated  ?

## 2021-06-10 NOTE — Discharge Instructions (Addendum)
Your CT scan did not show any acute findings in your bowel.  Incidentally, you have a gallbladder polyp that can be follow-up with your doctor outpatient ? ?I recommend that you take 2 Imodium tablets every time you have a diarrhea episode.  Do not take more than 10 pills of Imodium daily. ? ?You should follow-up with GI doctor if you have persistent diarrhea and cramps and pain ? ?Return to ER if you have worse abdominal pain, fever, dehydration ?

## 2021-06-10 NOTE — ED Notes (Signed)
Rx x 1 given  Written and verbal inst to pt  Verbalized an understanding  To home with family  

## 2021-06-10 NOTE — ED Provider Notes (Signed)
?[facility] EMERGENCY DEPARTMENT ?Provider Note ? ? ?CSN: [id number] ?Arrival date & time: 06/10/21  1800 ? ?  ? ?History ? ?Chief Complaint  ?Patient presents with  ? Abdominal Pain  ? ? ?Erik Reyes is a 36 y.o. male here presenting with abdominal pain and diarrhea.  Patient states that he has been having diarrhea for the last week or so.  It started after he was doing some ab workout and he thought that he strained his muscles.  Patient then started having loose stools every day.  He states that the stools are dark in nature.  He went to urgent care 3 days ago and was prescribed Bentyl and PPI.  He states that the medicines have not helped.  He has persistent cramps and diarrhea.  Denies any recent antibiotic use or history of C. difficile.  He states that he has no history of IBD or IBS ? ?The history is provided by the patient.  ? ?  ? ?Home Medications ?Prior to Admission medications   ?Medication Sig Start Date End Date Taking? Authorizing Provider  ?dicyclomine (BENTYL) 10 MG capsule Take 1 capsule (10 mg total) by mouth 3 (three) times daily before meals. 06/07/21 07/07/21  Theadora Rama Scales, PA-C  ?lansoprazole (PREVACID) 30 MG capsule Take 1 capsule (30 mg total) by mouth daily at 12 noon. 06/07/21 12/04/21  Theadora Rama Scales, PA-C  ?   ? ?Allergies    ?Patient has no known allergies.   ? ?Review of Systems   ?Review of Systems  ?Gastrointestinal:  Positive for abdominal pain and diarrhea.  ?All other systems reviewed and are negative. ? ?Physical Exam ?Updated Vital Signs ?BP 115/78 (BP Location: Left Arm)   Pulse 60   Temp 98.1 ?F (36.7 ?C) (Oral)   Resp 18   Ht 5\' 8"  (1.727 m)   Wt 59 kg   SpO2 99%   BMI 19.77 kg/m?  ?Physical Exam ?Vitals and nursing note reviewed.  ?Constitutional:   ?   Appearance: He is well-developed.  ?HENT:  ?   Head: Normocephalic.  ?Eyes:  ?   Extraocular Movements: Extraocular movements intact.  ?   Pupils: Pupils are equal, round, and reactive to  light.  ?Cardiovascular:  ?   Rate and Rhythm: Normal rate and regular rhythm.  ?   Heart sounds: Normal heart sounds.  ?Pulmonary:  ?   Effort: Pulmonary effort is normal.  ?   Breath sounds: Normal breath sounds.  ?Abdominal:  ?   General: Abdomen is flat.  ?   Comments: Mild left lower quadrant tenderness  ?Skin: ?   General: Skin is warm.  ?   Capillary Refill: Capillary refill takes less than 2 seconds.  ?Neurological:  ?   General: No focal deficit present.  ?   Mental Status: He is alert and oriented to person, place, and time.  ?Psychiatric:     ?   Behavior: Behavior normal.  ? ? ?ED Results / Procedures / Treatments   ?Labs ?(all labs ordered are listed, but only abnormal results are displayed) ?Labs Reviewed  ?LIPASE, BLOOD  ?COMPREHENSIVE METABOLIC PANEL  ?CBC  ?URINALYSIS, ROUTINE W REFLEX MICROSCOPIC  ? ? ?EKG ?None ? ?Radiology ?CT ABDOMEN PELVIS W CONTRAST ? ?Result Date: 06/10/2021 ?CLINICAL DATA:  Abdominal pain and loose stools. EXAM: CT ABDOMEN AND PELVIS WITH CONTRAST TECHNIQUE: Multidetector CT imaging of the abdomen and pelvis was performed using the standard protocol following bolus administration of intravenous contrast. RADIATION  DOSE REDUCTION: This exam was performed according to the departmental dose-optimization program which includes automated exposure control, adjustment of the mA and/or kV according to patient size and/or use of iterative reconstruction technique. CONTRAST:  OMNIPAQUE IOHEXOL 300 MG/ML  SOLN COMPARISON:  06/29/2020 FINDINGS: Lower chest: The lung bases are clear of acute process. No pleural effusion or pulmonary lesions. The heart is normal in size. No pericardial effusion. The distal esophagus and aorta are unremarkable. Hepatobiliary: No hepatic lesions or intrahepatic biliary dilatation. There is enhancement of the gallbladder wall which appears slightly irregular. Findings could be due to adenomyomatosis. Gallstone versus is enhancing polyp. No findings  suspicious for acute cholecystitis. No common bile duct dilatation. Pancreas: No mass, inflammation or ductal dilatation. Spleen: Normal size. No focal lesions. Stable accessory spleen in the splenic hilum. Adrenals/Urinary Tract: The adrenal glands and kidneys are unremarkable. No renal lesions or hydronephrosis. The bladder is unremarkable. Stomach/Bowel: The stomach, duodenum, small bowel and colon are unremarkable. No obvious acute inflammatory process, mass lesions or obstructive findings. The terminal ileum and appendix are normal. Vascular/Lymphatic: The aorta is normal in caliber. No dissection. The branch vessels are patent. The major venous structures are patent. No mesenteric or retroperitoneal mass or adenopathy. Small scattered lymph nodes are noted. Reproductive: The prostate gland and seminal vesicles are unremarkable. Other: No pelvic mass or adenopathy. No free pelvic fluid collections. No inguinal mass or adenopathy. No abdominal wall hernia or subcutaneous lesions. Musculoskeletal: The bony structures are unremarkable. IMPRESSION: 1. No acute abdominal/pelvic findings, mass lesions or adenopathy. 2. Non thickened but enhancing gallbladder wall with gallstone versus enhancing polyp. Possible adenomyomatosis. No findings to suggest acute cholecystitis. Electronically Signed   By: Rudie Meyer M.D.   On: 06/10/2021 21:40   ? ?Procedures ?Procedures  ? ? ?Medications Ordered in ED ?Medications  ?sodium chloride 0.9 % bolus 1,000 mL (1,000 mLs Intravenous New Bag/Given 06/10/21 2137)  ?iohexol (OMNIPAQUE) 300 MG/ML solution 100 mL (100 mLs Intravenous Contrast Given 06/10/21 2113)  ? ? ?ED Course/ Medical Decision Making/ A&P ?  ?                        ?Medical Decision Making ?[name] is a 35 y.o. male here presenting with abdominal pain and poor appetite and diarrhea.  Consider persistent gastroenteritis versus colitis versus diverticulitis.  We will get CBC and CMP and CT abdomen  pelvis. ? ?10:31 PM ?Labs unremarkable.  CT showed thickened gallbladder wall with gallstones versus polyp.  I do not think that is causing his symptoms right now.  I still think he has gastroenteritis.  Recommend Imodium to help him with diarrhea.  He can follow-up with GI outpatient.  If he has persistent pain and diarrhea he may need a colonoscopy for further evaluation ? ?Problems Addressed: ?Abdominal cramping: acute illness or injury ?Diarrhea, unspecified type: acute illness or injury ?Gallbladder polyp: acute illness or injury ? ?Amount and/or Complexity of Data Reviewed ?Labs: ordered. Decision-making details documented in ED Course. ?Radiology: ordered and independent interpretation performed. Decision-making details documented in ED Course. ? ?Risk ?Prescription drug management. ? ?Final Clinical Impression(s) / ED Diagnoses ?Final diagnoses:  ?Diarrhea, unspecified type  ?Abdominal cramping  ?Gallbladder polyp  ? ? ?Rx / DC Orders ?ED Discharge Orders   ? ? None  ? ?  ? ? ?  ?Charlynne Pander, MD ?06/10/21 2234 ? ?

## 2021-08-29 ENCOUNTER — Emergency Department (HOSPITAL_BASED_OUTPATIENT_CLINIC_OR_DEPARTMENT_OTHER): Payer: BC Managed Care – PPO

## 2021-08-29 ENCOUNTER — Encounter (HOSPITAL_BASED_OUTPATIENT_CLINIC_OR_DEPARTMENT_OTHER): Payer: Self-pay | Admitting: Emergency Medicine

## 2021-08-29 ENCOUNTER — Other Ambulatory Visit: Payer: Self-pay

## 2021-08-29 ENCOUNTER — Emergency Department (HOSPITAL_BASED_OUTPATIENT_CLINIC_OR_DEPARTMENT_OTHER)
Admission: EM | Admit: 2021-08-29 | Discharge: 2021-08-29 | Disposition: A | Discharge status: Home / Self Care | Payer: BC Managed Care – PPO | Attending: Emergency Medicine | Admitting: Emergency Medicine

## 2021-08-29 DIAGNOSIS — F17200 Nicotine dependence, unspecified, uncomplicated: Secondary | ICD-10-CM | POA: Diagnosis not present

## 2021-08-29 DIAGNOSIS — Z20822 Contact with and (suspected) exposure to covid-19: Secondary | ICD-10-CM | POA: Insufficient documentation

## 2021-08-29 DIAGNOSIS — J40 Bronchitis, not specified as acute or chronic: Secondary | ICD-10-CM | POA: Diagnosis not present

## 2021-08-29 DIAGNOSIS — R059 Cough, unspecified: Secondary | ICD-10-CM | POA: Diagnosis present

## 2021-08-29 HISTORY — DX: Fracture of neck, unspecified, initial encounter: S12.9XXA

## 2021-08-29 LAB — SARS CORONAVIRUS 2 BY RT PCR: SARS Coronavirus 2 by RT PCR: NEGATIVE

## 2021-08-29 MED ORDER — PREDNISONE 20 MG PO TABS: 60.0000 mg | ORAL_TABLET | Freq: Every day | ORAL | 0 refills | Status: AC

## 2021-08-29 MED ORDER — ALBUTEROL SULFATE HFA 108 (90 BASE) MCG/ACT IN AERS
4.0000 | INHALATION_SPRAY | Freq: Once | RESPIRATORY_TRACT | Status: AC
  Administered 2021-08-29: 4 via RESPIRATORY_TRACT
  Filled 2021-08-29: qty 6.7

## 2021-08-29 MED ORDER — PREDNISONE 50 MG PO TABS
60.0000 mg | ORAL_TABLET | Freq: Once | ORAL | Status: AC
  Administered 2021-08-29: 60 mg via ORAL
  Filled 2021-08-29: qty 1

## 2021-08-29 MED ORDER — AZITHROMYCIN 250 MG PO TABS: 250.0000 mg | ORAL_TABLET | Freq: Every day | ORAL | 0 refills | Status: DC

## 2021-08-29 NOTE — ED Provider Notes (Signed)
[facility] EMERGENCY DEPARTMENT Provider Note   CSN: [id number] Arrival date & time: 08/29/21  2056     History  Chief Complaint  Patient presents with   Cough    Erik Reyes is a 36 y.o. male.  Patient here with cough for the last several days.  History of smoking.  No COPD asthma history.  Denies any fevers or chills.  Nothing makes it worse or better.  No recent surgery or travel.  No significant chest pain, abdominal pain, shortness of breath, nausea, vomiting, diarrhea.  The history is provided by the patient.       Home Medications Prior to Admission medications   Medication Sig Start Date End Date Taking? Authorizing Provider  azithromycin (ZITHROMAX) 250 MG tablet Take 1 tablet (250 mg total) by mouth daily. Take first 2 tablets together, then 1 every day until finished. 08/29/21  Yes Catheryn Slifer, DO  predniSONE (DELTASONE) 20 MG tablet Take 3 tablets (60 mg total) by mouth daily for 5 days. 08/29/21 09/03/21 Yes Ellinor Test, DO  dicyclomine (BENTYL) 10 MG capsule Take 1 capsule (10 mg total) by mouth 3 (three) times daily before meals. 06/07/21 07/07/21  Theadora Rama Scales, PA-C  lansoprazole (PREVACID) 30 MG capsule Take 1 capsule (30 mg total) by mouth daily at 12 noon. 06/07/21 12/04/21  Theadora Rama Scales, PA-C  loperamide (IMODIUM) 2 MG capsule Take 2 capsules (4 mg total) by mouth 4 (four) times daily as needed for diarrhea or loose stools. 06/10/21   Charlynne Pander, MD      Allergies    Patient has no known allergies.    Review of Systems   Review of Systems  Physical Exam Updated Vital Signs BP 129/84 (BP Location: Right Arm)   Pulse 94   Temp 98.8 F (37.1 C) (Oral)   Resp (!) 22   Ht 5\' 8"  (1.727 m)   Wt 57.8 kg   SpO2 95%   BMI 19.39 kg/m  Physical Exam Vitals and nursing note reviewed.  Constitutional:      General: He is not in acute distress.    Appearance: He is well-developed. He is not ill-appearing.  HENT:      Head: Normocephalic and atraumatic.     Nose: Nose normal.     Mouth/Throat:     Mouth: Mucous membranes are moist.  Eyes:     Extraocular Movements: Extraocular movements intact.     Conjunctiva/sclera: Conjunctivae normal.     Pupils: Pupils are equal, round, and reactive to light.  Cardiovascular:     Rate and Rhythm: Normal rate and regular rhythm.     Pulses: Normal pulses.     Heart sounds: Normal heart sounds. No murmur heard. Pulmonary:     Effort: Pulmonary effort is normal. No respiratory distress.     Breath sounds: Normal breath sounds.  Abdominal:     Palpations: Abdomen is soft.     Tenderness: There is no abdominal tenderness.  Musculoskeletal:        General: No swelling.     Cervical back: Neck supple.  Skin:    General: Skin is warm and dry.     Capillary Refill: Capillary refill takes less than 2 seconds.  Neurological:     General: No focal deficit present.     Mental Status: He is alert.  Psychiatric:        Mood and Affect: Mood normal.     ED Results / Procedures / Treatments  Labs (all labs ordered are listed, but only abnormal results are displayed) Labs Reviewed  SARS CORONAVIRUS 2 BY RT PCR    EKG EKG Interpretation  Date/Time:  Saturday August 29 2021 21:09:31 EDT Ventricular Rate:  87 PR Interval:  152 QRS Duration: 94 QT Interval:  342 QTC Calculation: 411 R Axis:   67 Text Interpretation: Normal sinus rhythm Normal ECG When compared with ECG of 29-Jun-2020 10:58, PREVIOUS ECG IS PRESENT Confirmed by Virgina Norfolk ([id number]) on 08/29/2021 9:25:11 PM  Radiology DG Chest 2 View  Result Date: 08/29/2021 CLINICAL DATA:  Dyspnea, cough EXAM: CHEST - 2 VIEW COMPARISON:  07/15/2017 FINDINGS: The heart size and mediastinal contours are within normal limits. Both lungs are clear. The visualized skeletal structures are unremarkable. IMPRESSION: No active cardiopulmonary disease. Electronically Signed   By: Helyn Numbers M.D.   On: 08/29/2021  21:55    Procedures Procedures    Medications Ordered in ED Medications  albuterol (VENTOLIN HFA) 108 (90 Base) MCG/ACT inhaler 4 puff (has no administration in time range)  predniSONE (DELTASONE) tablet 60 mg (60 mg Oral Given 08/29/21 2219)    ED Course/ Medical Decision Making/ A&P                           Medical Decision Making Amount and/or Complexity of Data Reviewed Radiology: ordered.  Risk Prescription drug management.   Kristine Garbe Brandau is here with cough.  Normal vitals.  No fever.  No significant medical history except for significant smoking history.  Clear breath sounds.  Very dry harsh cough in the room.  Suspect differential diagnosis is viral process versus allergic process versus bronchitis versus reactive airway process.  We will get chest x-ray and COVID test.  EKG per my review and interpretation shows sinus rhythm.  No ischemic changes.  Is not having any chest pain.  I have no concern for PE or ACS.  Per my review and interpretation of chest x-ray there is no pneumonia.  No pneumothorax.  Overall suspect bronchitis.  Patient given prednisone and albuterol.  Will prescribe Z-Pak.  Understands return precautions.  Discharged in good condition.  This chart was dictated using voice recognition software.  Despite best efforts to proofread,  errors can occur which can change the documentation meaning.         Final Clinical Impression(s) / ED Diagnoses Final diagnoses:  Bronchitis    Rx / DC Orders ED Discharge Orders          Ordered    predniSONE (DELTASONE) 20 MG tablet  Daily        08/29/21 2218    azithromycin (ZITHROMAX) 250 MG tablet  Daily        08/29/21 2218              Virgina Norfolk, DO 08/29/21 2221
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
## 2021-08-29 NOTE — Discharge Instructions (Addendum)
Take your next dose of steroid tomorrow.  Recommend using 2 to 4 puffs of albuterol inhaler every 4 hours as needed.  Take your first dose antibiotic tonight called the Zithromax.

## 2021-08-29 NOTE — ED Notes (Signed)
Patient verbalizes understanding of discharge instructions. Opportunity for questioning and answers were provided. Armband removed by staff, pt discharged from ED. Ambulated out to lobby  

## 2022-02-01 ENCOUNTER — Emergency Department (HOSPITAL_BASED_OUTPATIENT_CLINIC_OR_DEPARTMENT_OTHER)
Admission: EM | Admit: 2022-02-01 | Discharge: 2022-02-02 | Disposition: A | Discharge status: Home / Self Care | Payer: BC Managed Care – PPO | Attending: Emergency Medicine | Admitting: Emergency Medicine

## 2022-02-01 ENCOUNTER — Other Ambulatory Visit: Payer: Self-pay

## 2022-02-01 ENCOUNTER — Encounter (HOSPITAL_BASED_OUTPATIENT_CLINIC_OR_DEPARTMENT_OTHER): Payer: Self-pay

## 2022-02-01 DIAGNOSIS — S161XXA Strain of muscle, fascia and tendon at neck level, initial encounter: Secondary | ICD-10-CM | POA: Diagnosis not present

## 2022-02-01 DIAGNOSIS — F17210 Nicotine dependence, cigarettes, uncomplicated: Secondary | ICD-10-CM | POA: Insufficient documentation

## 2022-02-01 DIAGNOSIS — S199XXA Unspecified injury of neck, initial encounter: Secondary | ICD-10-CM | POA: Diagnosis present

## 2022-02-01 DIAGNOSIS — M7918 Myalgia, other site: Secondary | ICD-10-CM | POA: Insufficient documentation

## 2022-02-01 DIAGNOSIS — Y92410 Unspecified street and highway as the place of occurrence of the external cause: Secondary | ICD-10-CM | POA: Diagnosis not present
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
## 2022-02-01 NOTE — ED Notes (Signed)
C-collar in place

## 2022-02-02 ENCOUNTER — Emergency Department (HOSPITAL_BASED_OUTPATIENT_CLINIC_OR_DEPARTMENT_OTHER): Payer: BC Managed Care – PPO

## 2022-02-02 MED ORDER — CYCLOBENZAPRINE HCL 10 MG PO TABS: 10.0000 mg | ORAL_TABLET | Freq: Three times a day (TID) | ORAL | 0 refills | Status: AC | PRN

## 2022-02-02 MED ORDER — CYCLOBENZAPRINE HCL 10 MG PO TABS
10.0000 mg | ORAL_TABLET | Freq: Once | ORAL | Status: AC
  Administered 2022-02-02: 10 mg via ORAL
  Filled 2022-02-02: qty 1

## 2022-02-02 MED ORDER — NAPROXEN 250 MG PO TABS
500.0000 mg | ORAL_TABLET | Freq: Once | ORAL | Status: AC
  Administered 2022-02-02: 500 mg via ORAL
  Filled 2022-02-02: qty 2

## 2022-02-02 MED ORDER — HYDROCODONE-ACETAMINOPHEN 5-325 MG PO TABS
1.0000 | ORAL_TABLET | Freq: Once | ORAL | Status: AC
  Administered 2022-02-02: 1 via ORAL
  Filled 2022-02-02: qty 1
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
## 2022-07-01 ENCOUNTER — Encounter: Payer: Self-pay | Admitting: *Deleted

## 2023-02-04 ENCOUNTER — Emergency Department (HOSPITAL_BASED_OUTPATIENT_CLINIC_OR_DEPARTMENT_OTHER): Payer: Self-pay

## 2023-02-04 ENCOUNTER — Emergency Department (HOSPITAL_BASED_OUTPATIENT_CLINIC_OR_DEPARTMENT_OTHER)
Admission: EM | Admit: 2023-02-04 | Discharge: 2023-02-04 | Disposition: A | Discharge status: Home / Self Care | Payer: Commercial Managed Care - HMO | Attending: Emergency Medicine | Admitting: Emergency Medicine

## 2023-02-04 ENCOUNTER — Encounter (HOSPITAL_BASED_OUTPATIENT_CLINIC_OR_DEPARTMENT_OTHER): Payer: Self-pay | Admitting: Emergency Medicine

## 2023-02-04 ENCOUNTER — Other Ambulatory Visit: Payer: Self-pay

## 2023-02-04 DIAGNOSIS — Z20822 Contact with and (suspected) exposure to covid-19: Secondary | ICD-10-CM | POA: Insufficient documentation

## 2023-02-04 DIAGNOSIS — J209 Acute bronchitis, unspecified: Secondary | ICD-10-CM | POA: Insufficient documentation

## 2023-02-04 DIAGNOSIS — F17200 Nicotine dependence, unspecified, uncomplicated: Secondary | ICD-10-CM | POA: Insufficient documentation

## 2023-02-04 DIAGNOSIS — R0602 Shortness of breath: Secondary | ICD-10-CM | POA: Diagnosis present

## 2023-02-04 LAB — RESP PANEL BY RT-PCR (RSV, FLU A&B, COVID)  RVPGX2
Influenza A by PCR: NEGATIVE
Influenza B by PCR: NEGATIVE
Resp Syncytial Virus by PCR: NEGATIVE
SARS Coronavirus 2 by RT PCR: NEGATIVE

## 2023-02-04 MED ORDER — ALBUTEROL SULFATE HFA 108 (90 BASE) MCG/ACT IN AERS: 1.0000 | INHALATION_SPRAY | Freq: Four times a day (QID) | RESPIRATORY_TRACT | 0 refills | Status: AC | PRN

## 2023-02-04 MED ORDER — PREDNISONE 20 MG PO TABS: 40.0000 mg | ORAL_TABLET | Freq: Every day | ORAL | 0 refills | Status: AC

## 2023-02-04 MED ORDER — HYDROCOD POLI-CHLORPHE POLI ER 10-8 MG/5ML PO SUER
5.0000 mL | Freq: Once | ORAL | Status: AC
  Administered 2023-02-04: 5 mL via ORAL
  Filled 2023-02-04: qty 5

## 2023-02-04 MED ORDER — HYDROCOD POLI-CHLORPHE POLI ER 10-8 MG/5ML PO SUER: 5.0000 mL | Freq: Every evening | ORAL | 0 refills | Status: DC | PRN

## 2023-02-04 MED ORDER — ALBUTEROL SULFATE HFA 108 (90 BASE) MCG/ACT IN AERS
1.0000 | INHALATION_SPRAY | Freq: Once | RESPIRATORY_TRACT | Status: AC
  Administered 2023-02-04: 2 via RESPIRATORY_TRACT
  Filled 2023-02-04: qty 6.7

## 2023-02-04 NOTE — ED Triage Notes (Signed)
Pt reports cough and SHOB x 2d; also reports chills

## 2023-02-04 NOTE — ED Provider Notes (Signed)
[facility]  EMERGENCY DEPARTMENT AT [facility] Provider Note   CSN: [id number] Arrival date & time: 02/04/23  1525     History  Chief Complaint  Patient presents with   Shortness of Breath    DRYSTAN BOUSE is a 37 y.o. male with past medical history significant for tobacco use disorder, patient reports that he smokes around 1-1/2 packs of cigarettes per day who presents with concern for 2 to 3 days of cough, congestion, coughing up green phlegm.  Patient reports that has been difficult to sleep because of the cough.  He reports he is only been able to smoke half pack a day because of the symptoms.  He denies objective fever but does report some chills.  No known recent sick contacts.   Shortness of Breath      Home Medications Prior to Admission medications   Medication Sig Start Date End Date Taking? Authorizing Provider  albuterol (VENTOLIN HFA) 108 (90 Base) MCG/ACT inhaler Inhale 1-2 puffs into the lungs every 6 (six) hours as needed for wheezing or shortness of breath. 02/04/23  Yes Silena Wyss H, PA-C  chlorpheniramine-HYDROcodone (TUSSIONEX) 10-8 MG/5ML Take 5 mLs by mouth at bedtime as needed for cough. 02/04/23  Yes Alizandra Loh H, PA-C  predniSONE (DELTASONE) 20 MG tablet Take 2 tablets (40 mg total) by mouth daily. 02/04/23  Yes Kymora Sciara H, PA-C  cyclobenzaprine (FLEXERIL) 10 MG tablet Take 1 tablet (10 mg total) by mouth 3 (three) times daily as needed for muscle spasms. 02/02/22   Molpus, John, MD  dicyclomine (BENTYL) 10 MG capsule Take 1 capsule (10 mg total) by mouth 3 (three) times daily before meals. 06/07/21 07/07/21  Theadora Rama Scales, PA-C  lansoprazole (PREVACID) 30 MG capsule Take 1 capsule (30 mg total) by mouth daily at 12 noon. 06/07/21 12/04/21  Theadora Rama Scales, PA-C  loperamide (IMODIUM) 2 MG capsule Take 2 capsules (4 mg total) by mouth 4 (four) times daily as needed for diarrhea or loose stools. 06/10/21    Charlynne Pander, MD      Allergies    Patient has no known allergies.    Review of Systems   Review of Systems  Respiratory:  Positive for shortness of breath.   All other systems reviewed and are negative.   Physical Exam Updated Vital Signs BP 125/82   Pulse 95   Temp 98.8 F (37.1 C) (Oral)   Resp 16   Ht 5\' 8"  (1.727 m)   Wt 61.7 kg   SpO2 97%   BMI 20.68 kg/m  Physical Exam Vitals and nursing note reviewed.  Constitutional:      General: He is not in acute distress.    Appearance: Normal appearance.  HENT:     Head: Normocephalic and atraumatic.  Eyes:     General:        Right eye: No discharge.        Left eye: No discharge.  Cardiovascular:     Rate and Rhythm: Normal rate and regular rhythm.     Heart sounds: No murmur heard.    No friction rub. No gallop.  Pulmonary:     Effort: Pulmonary effort is normal.     Breath sounds: Normal breath sounds.     Comments: Patient with no wheezing, but scattered rhonchi, and persistent hacking cough throughout exam.  When not coughing he is in no respiratory distress. Abdominal:     General: Bowel sounds are normal.  Palpations: Abdomen is soft.  Skin:    General: Skin is warm and dry.     Capillary Refill: Capillary refill takes less than 2 seconds.  Neurological:     Mental Status: He is alert and oriented to person, place, and time.  Psychiatric:        Mood and Affect: Mood normal.        Behavior: Behavior normal.     ED Results / Procedures / Treatments   Labs (all labs ordered are listed, but only abnormal results are displayed) Labs Reviewed  RESP PANEL BY RT-PCR (RSV, FLU A&B, COVID)  [id number]    EKG None  Radiology DG Chest 2 View  Result Date: 02/04/2023 CLINICAL DATA:  Cough and shortness of breath. EXAM: CHEST - 2 VIEW COMPARISON:  Cough and shortness of breath. FINDINGS: The lungs are clear without focal pneumonia, edema, pneumothorax or pleural effusion. The cardiopericardial  silhouette is within normal limits for size. No acute bony abnormality. IMPRESSION: No active cardiopulmonary disease. Electronically Signed   By: Kennith Center M.D.   On: 02/04/2023 16:52    Procedures Procedures    Medications Ordered in ED Medications  chlorpheniramine-HYDROcodone (TUSSIONEX) 10-8 MG/5ML suspension 5 mL (has no administration in time range)  albuterol (VENTOLIN HFA) 108 (90 Base) MCG/ACT inhaler 1-2 puff (2 puffs Inhalation Given 02/04/23 1814)    ED Course/ Medical Decision Making/ A&P                                 Medical Decision Making Amount and/or Complexity of Data Reviewed Radiology: ordered.   This is a well-appearing 37 year old male with significant past medical history of tobacco abuse who presents with concern for 3 days of cough, congestion, chills.  My emergent differential diagnosis includes acute upper respiratory infection with COVID, flu, RSV versus new asthma presentation, acute bronchitis, less clinical concern for pneumonia.  Also considered other ENT emergencies, Ludwig angina, strep pharyngitis, mono, versus epiglottis, tonsillitis versus other.  This is not an exhaustive differential.  On my exam patient is overall well-appearing, they have temperature of 98.8, breathing unlabored, no tachypnea, no respiratory distress, stable oxygen saturation.  Patient with some tachycardia initially but after resting in bed, not coughing his heart rate has improved significantly. RVP independently reviewed by myself shows negative for COVID, flu, RSV.  I independently interpreted plain film chest x-ray which shows no evidence of acute intrathoracic abnormality, pneumonia..  Patient symptoms are consistent with upper respiratory infection/acute bronchitis.  Given cough syrup, steroids, inhaler. encouraged ibuprofen, Tylenol, rest, plenty of fluids.  Discussed smoking cessation with patient and was they were offerred resources to help stop.  Total time was 5 min  CPT code 20254. Discussed extensive return precautions.  Patient discharged in stable condition at this time.  Final Clinical Impression(s) / ED Diagnoses Final diagnoses:  SOB (shortness of breath)  Acute bronchitis, unspecified organism  Tobacco use disorder    Rx / DC Orders ED Discharge Orders          Ordered    predniSONE (DELTASONE) 20 MG tablet  Daily        02/04/23 1810    chlorpheniramine-HYDROcodone (TUSSIONEX) 10-8 MG/5ML  At bedtime PRN        02/04/23 1810    albuterol (VENTOLIN HFA) 108 (90 Base) MCG/ACT inhaler  Every 6 hours PRN        02/04/23 1810  Olene Floss, PA-C 02/04/23 1818    Glyn Ade, MD 02/04/23 2148

## 2023-02-04 NOTE — Discharge Instructions (Addendum)
I recommend you take the steroids I prescribed, use the inhaler as needed. You can use the medicated cough syrup as needed at night for cough.  Please do your best to stop smoking if you are able as it will significantly decrease the severity of any respiratory illness in the future.

## 2023-02-05 ENCOUNTER — Telehealth (HOSPITAL_BASED_OUTPATIENT_CLINIC_OR_DEPARTMENT_OTHER): Payer: Self-pay | Admitting: Emergency Medicine

## 2023-02-05 DIAGNOSIS — J209 Acute bronchitis, unspecified: Secondary | ICD-10-CM

## 2023-02-05 MED ORDER — HYDROCOD POLI-CHLORPHE POLI ER 10-8 MG/5ML PO SUER: 5.0000 mL | Freq: Every evening | ORAL | 0 refills | Status: AC | PRN

## 2023-02-05 NOTE — Telephone Encounter (Cosign Needed)
Pharmacy called regarding prescription from yesterday's ER visit.  Patient was prescribed Tussionex cough syrup, but pharmacy does not carry this medication.  Asked if prescription could be changed to different pharmacy.  I reviewed patient's chart including ED note and PDMP, it appears consistent with pharmacy report.  Will attempt to discontinue previous prescription and send to [facility] on [address].

## 2023-12-03 IMAGING — CR DG CHEST 2V
2 series · 2 of 2 positions shown · non-contrast
Comparison: 07/15/2017

CLINICAL DATA: Dyspnea, cough

EXAM:
CHEST - 2 VIEW

[w chest pa]
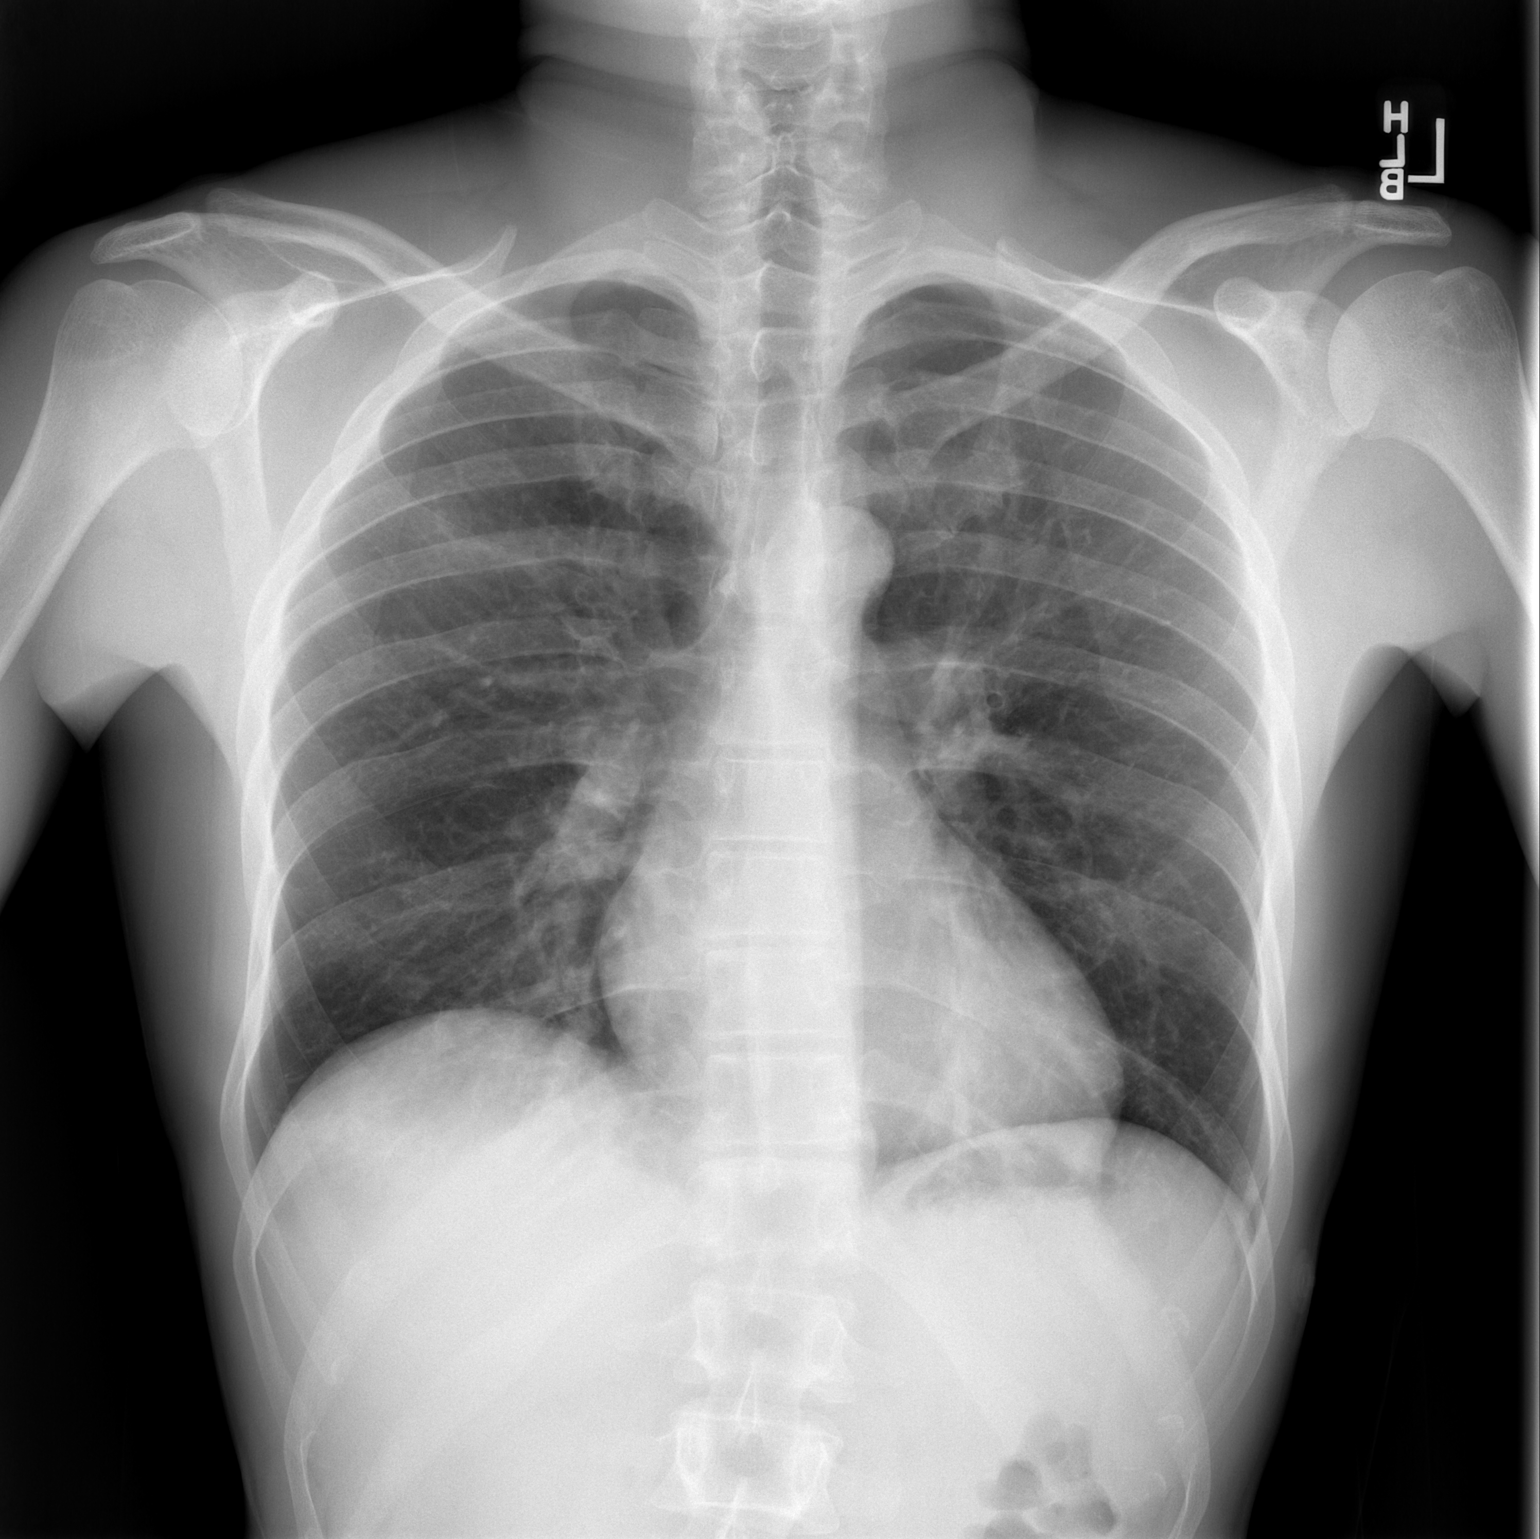

[w chest lat]
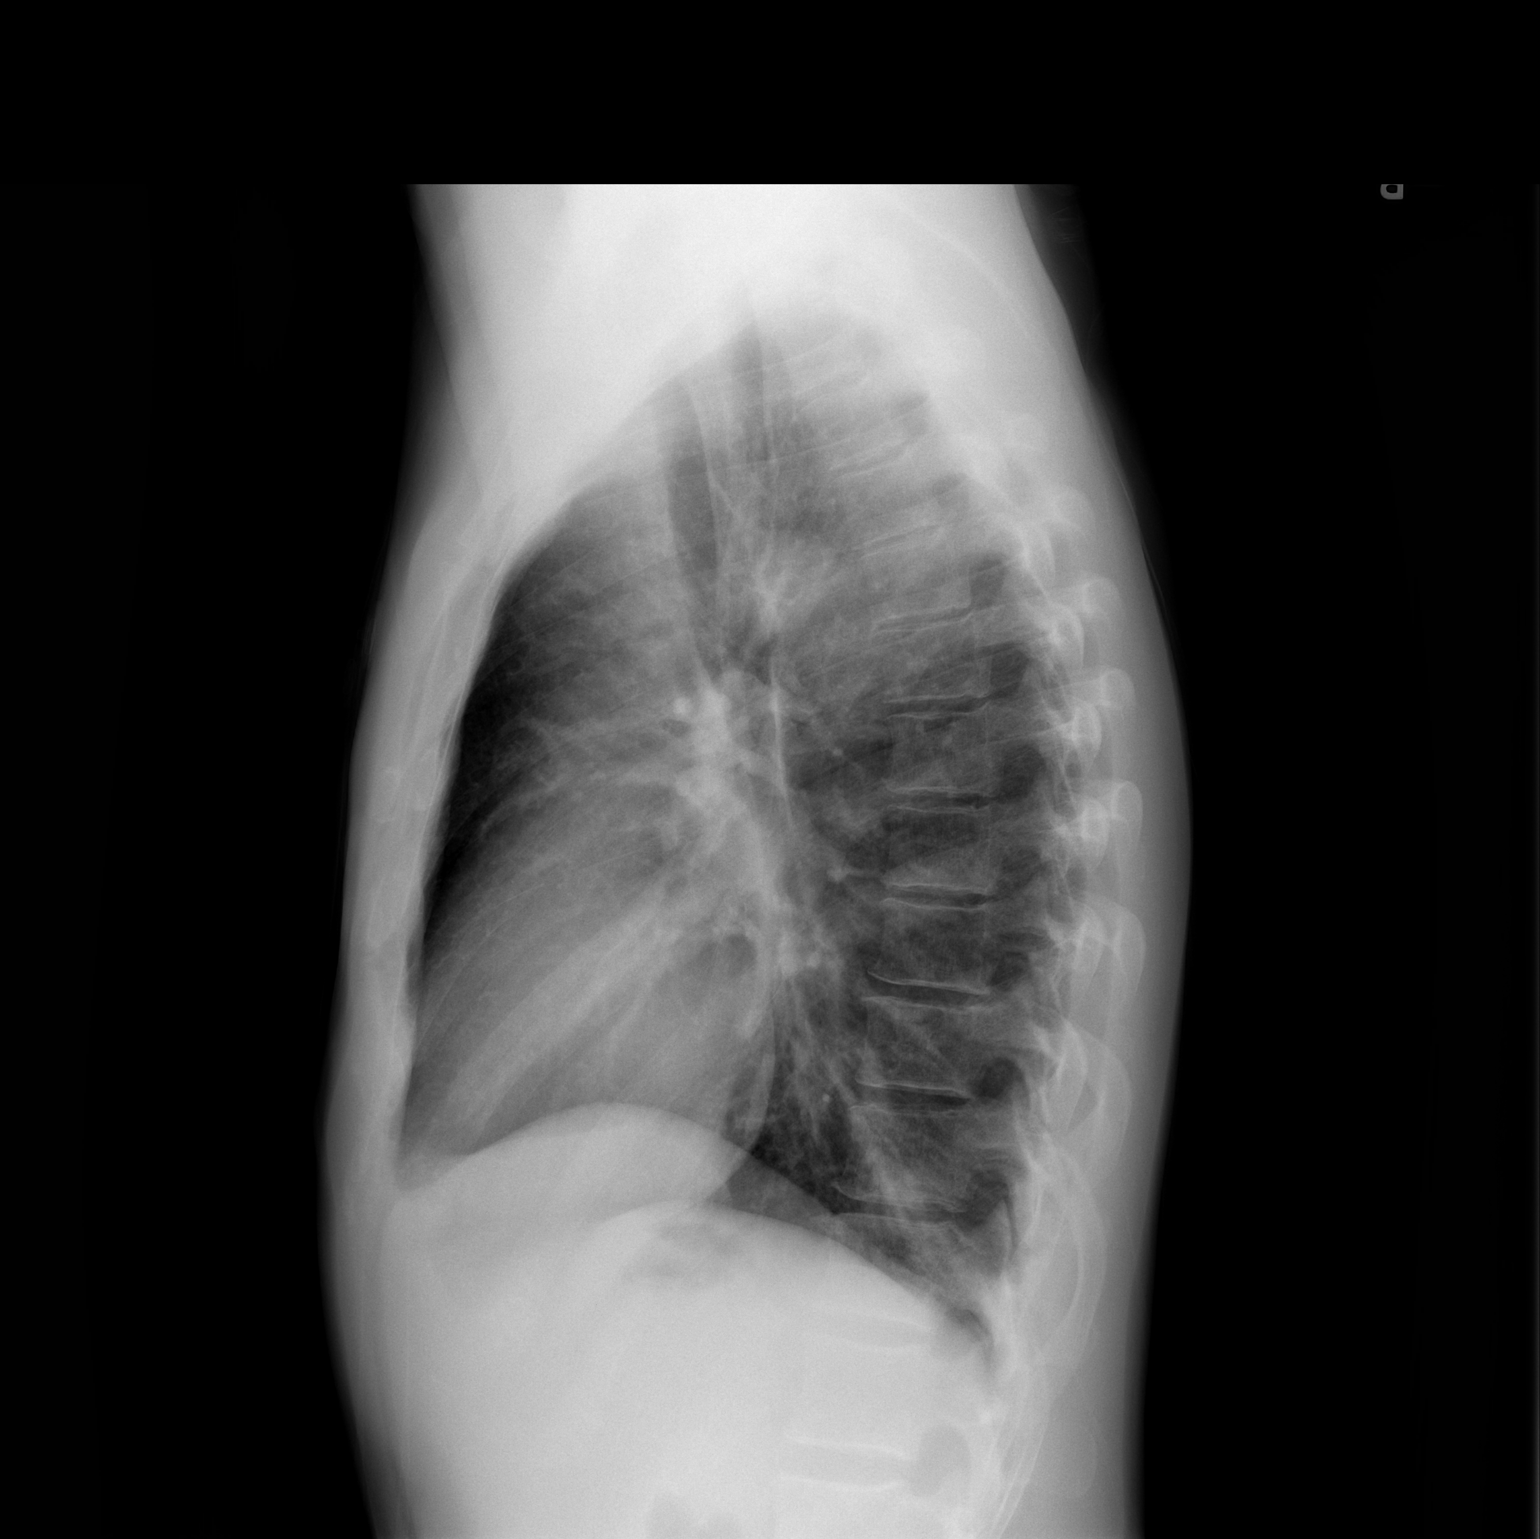

[2 of 2 positions shown; findings below may reference images not displayed]

FINDINGS: The heart size and mediastinal contours are within normal limits.
Both lungs are clear. The visualized skeletal structures are
unremarkable.
IMPRESSION: No active cardiopulmonary disease.
